# Patient Record
Sex: Female | Born: 1965 | Race: White | Hispanic: No | Marital: Married | Smoking: Former smoker
Health system: Southern US, Community
[De-identification: ages and names within clinical notes are randomized; demographics above are authoritative.]

## PROBLEM LIST (undated history)

## (undated) DIAGNOSIS — F411 Generalized anxiety disorder: Secondary | ICD-10-CM

## (undated) DIAGNOSIS — G5600 Carpal tunnel syndrome, unspecified upper limb: Secondary | ICD-10-CM

## (undated) DIAGNOSIS — F419 Anxiety disorder, unspecified: Secondary | ICD-10-CM

## (undated) HISTORY — DX: Generalized anxiety disorder: F41.1

## (undated) HISTORY — DX: Carpal tunnel syndrome, unspecified upper limb: G56.00

## (undated) HISTORY — DX: Anxiety disorder, unspecified: F41.9

---
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
## 1998-10-01 ENCOUNTER — Ambulatory Visit (HOSPITAL_COMMUNITY): Admission: RE | Admit: 1998-10-01 | Discharge: 1998-10-01 | Payer: Self-pay | Admitting: Obstetrics and Gynecology

## 1998-11-09 ENCOUNTER — Ambulatory Visit (HOSPITAL_COMMUNITY): Admission: RE | Admit: 1998-11-09 | Discharge: 1998-11-09 | Payer: Self-pay | Admitting: Obstetrics and Gynecology

## 1999-10-19 ENCOUNTER — Encounter: Payer: Self-pay | Admitting: Emergency Medicine

## 1999-10-19 ENCOUNTER — Emergency Department (HOSPITAL_COMMUNITY): Admission: EM | Admit: 1999-10-19 | Discharge: 1999-10-19 | Payer: Self-pay | Admitting: Emergency Medicine

## 2000-01-26 ENCOUNTER — Other Ambulatory Visit: Admission: RE | Admit: 2000-01-26 | Discharge: 2000-01-26 | Payer: Self-pay | Admitting: Obstetrics and Gynecology

## 2000-04-01 ENCOUNTER — Emergency Department (HOSPITAL_COMMUNITY): Admission: EM | Admit: 2000-04-01 | Discharge: 2000-04-01 | Payer: Self-pay | Admitting: Emergency Medicine

## 2000-04-01 ENCOUNTER — Encounter: Payer: Self-pay | Admitting: Emergency Medicine

## 2001-02-05 ENCOUNTER — Other Ambulatory Visit: Admission: RE | Admit: 2001-02-05 | Discharge: 2001-02-05 | Payer: Self-pay | Admitting: Obstetrics and Gynecology

## 2002-03-14 ENCOUNTER — Emergency Department (HOSPITAL_COMMUNITY): Admission: EM | Admit: 2002-03-14 | Discharge: 2002-03-14 | Payer: Self-pay | Admitting: Emergency Medicine

## 2002-05-15 ENCOUNTER — Other Ambulatory Visit: Admission: RE | Admit: 2002-05-15 | Discharge: 2002-05-15 | Payer: Self-pay | Admitting: Obstetrics and Gynecology

## 2002-08-06 ENCOUNTER — Emergency Department (HOSPITAL_COMMUNITY): Admission: EM | Admit: 2002-08-06 | Discharge: 2002-08-06 | Payer: Self-pay | Admitting: Emergency Medicine

## 2002-08-06 ENCOUNTER — Encounter: Payer: Self-pay | Admitting: Emergency Medicine

## 2003-05-28 ENCOUNTER — Other Ambulatory Visit: Admission: RE | Admit: 2003-05-28 | Discharge: 2003-05-28 | Payer: Self-pay | Admitting: Obstetrics and Gynecology

## 2004-07-27 ENCOUNTER — Other Ambulatory Visit: Admission: RE | Admit: 2004-07-27 | Discharge: 2004-07-27 | Payer: Self-pay | Admitting: Obstetrics and Gynecology

## 2005-04-04 ENCOUNTER — Other Ambulatory Visit: Admission: RE | Admit: 2005-04-04 | Discharge: 2005-04-04 | Payer: Self-pay | Admitting: Obstetrics and Gynecology

## 2005-09-05 ENCOUNTER — Encounter: Admission: RE | Admit: 2005-09-05 | Discharge: 2005-11-06 | Payer: Self-pay | Admitting: Obstetrics and Gynecology

## 2005-11-07 ENCOUNTER — Inpatient Hospital Stay (HOSPITAL_COMMUNITY): Admission: AD | Admit: 2005-11-07 | Discharge: 2005-11-11 | Payer: Self-pay | Admitting: Obstetrics and Gynecology

## 2005-12-21 ENCOUNTER — Other Ambulatory Visit: Admission: RE | Admit: 2005-12-21 | Discharge: 2005-12-21 | Payer: Self-pay | Admitting: Obstetrics and Gynecology

## 2006-02-21 ENCOUNTER — Ambulatory Visit: Payer: Self-pay | Admitting: Family Medicine

## 2006-09-26 ENCOUNTER — Ambulatory Visit: Payer: Self-pay | Admitting: Family Medicine

## 2006-12-19 ENCOUNTER — Ambulatory Visit: Payer: Self-pay | Admitting: Family Medicine

## 2007-01-21 ENCOUNTER — Ambulatory Visit: Payer: Self-pay | Admitting: Family Medicine

## 2007-01-23 ENCOUNTER — Ambulatory Visit: Payer: Self-pay | Admitting: Family Medicine

## 2007-04-15 ENCOUNTER — Ambulatory Visit: Payer: Self-pay | Admitting: Internal Medicine

## 2007-04-15 DIAGNOSIS — F411 Generalized anxiety disorder: Secondary | ICD-10-CM

## 2007-04-15 HISTORY — DX: Generalized anxiety disorder: F41.1

## 2007-09-11 ENCOUNTER — Ambulatory Visit: Payer: Self-pay | Admitting: Family Medicine

## 2007-09-11 LAB — CONVERTED CEMR LAB: Rapid Strep: NEGATIVE

## 2007-09-13 ENCOUNTER — Telehealth (INDEPENDENT_AMBULATORY_CARE_PROVIDER_SITE_OTHER): Payer: Self-pay | Admitting: Internal Medicine

## 2008-03-10 ENCOUNTER — Ambulatory Visit: Payer: Self-pay | Admitting: Family Medicine

## 2008-03-10 DIAGNOSIS — M545 Low back pain, unspecified: Secondary | ICD-10-CM | POA: Insufficient documentation

## 2008-04-03 ENCOUNTER — Encounter: Admission: RE | Admit: 2008-04-03 | Discharge: 2008-04-03 | Payer: Self-pay | Admitting: Obstetrics and Gynecology

## 2008-10-07 ENCOUNTER — Ambulatory Visit: Payer: Self-pay | Admitting: Family Medicine

## 2008-10-07 DIAGNOSIS — R1013 Epigastric pain: Secondary | ICD-10-CM | POA: Insufficient documentation

## 2008-10-07 DIAGNOSIS — R1032 Left lower quadrant pain: Secondary | ICD-10-CM | POA: Insufficient documentation

## 2008-12-24 ENCOUNTER — Ambulatory Visit: Payer: Self-pay | Admitting: Family Medicine

## 2008-12-24 DIAGNOSIS — M722 Plantar fascial fibromatosis: Secondary | ICD-10-CM | POA: Insufficient documentation

## 2008-12-24 DIAGNOSIS — M5430 Sciatica, unspecified side: Secondary | ICD-10-CM | POA: Insufficient documentation

## 2009-02-10 ENCOUNTER — Ambulatory Visit: Payer: Self-pay | Admitting: Internal Medicine

## 2009-02-10 DIAGNOSIS — M542 Cervicalgia: Secondary | ICD-10-CM | POA: Insufficient documentation

## 2009-04-06 ENCOUNTER — Ambulatory Visit: Payer: Self-pay | Admitting: Family Medicine

## 2009-04-06 DIAGNOSIS — R5381 Other malaise: Secondary | ICD-10-CM | POA: Insufficient documentation

## 2009-04-06 DIAGNOSIS — R0609 Other forms of dyspnea: Secondary | ICD-10-CM | POA: Insufficient documentation

## 2009-04-06 DIAGNOSIS — R5383 Other fatigue: Secondary | ICD-10-CM

## 2009-04-06 DIAGNOSIS — R0989 Other specified symptoms and signs involving the circulatory and respiratory systems: Secondary | ICD-10-CM

## 2009-04-07 LAB — CONVERTED CEMR LAB
ALT: 16 units/L (ref 0–35)
AST: 14 units/L (ref 0–37)
Albumin: 3.4 g/dL — ABNORMAL LOW (ref 3.5–5.2)
Alkaline Phosphatase: 93 units/L (ref 39–117)
BUN: 7 mg/dL (ref 6–23)
Basophils Absolute: 0 10*3/uL (ref 0.0–0.1)
Basophils Relative: 0.4 % (ref 0.0–3.0)
Bilirubin, Direct: 0 mg/dL (ref 0.0–0.3)
CO2: 28 meq/L (ref 19–32)
Calcium: 9.3 mg/dL (ref 8.4–10.5)
Chloride: 106 meq/L (ref 96–112)
Cholesterol: 175 mg/dL (ref 0–200)
Creatinine, Ser: 0.7 mg/dL (ref 0.4–1.2)
Direct LDL: 91.9 mg/dL
Eosinophils Absolute: 0.2 10*3/uL (ref 0.0–0.7)
Eosinophils Relative: 1.4 % (ref 0.0–5.0)
GFR calc non Af Amer: 97.77 mL/min (ref >60)
Glucose, Bld: 85 mg/dL (ref 70–99)
HCT: 38.8 % (ref 36.0–46.0)
HDL: 46.6 mg/dL (ref >39.00)
Hemoglobin: 13.3 g/dL (ref 12.0–15.0)
Lymphocytes Relative: 26.8 % (ref 12.0–46.0)
Lymphs Abs: 3 10*3/uL (ref 0.7–4.0)
MCHC: 34.3 g/dL (ref 30.0–36.0)
MCV: 89.7 fL (ref 78.0–100.0)
Monocytes Absolute: 0.7 10*3/uL (ref 0.1–1.0)
Monocytes Relative: 6.3 % (ref 3.0–12.0)
Neutro Abs: 7.4 10*3/uL (ref 1.4–7.7)
Neutrophils Relative %: 65.1 % (ref 43.0–77.0)
Platelets: 335 10*3/uL (ref 150.0–400.0)
Potassium: 4.6 meq/L (ref 3.5–5.1)
RBC: 4.33 M/uL (ref 3.87–5.11)
RDW: 13.5 % (ref 11.5–14.6)
Sodium: 140 meq/L (ref 135–145)
TSH: 1.76 microintl units/mL (ref 0.35–5.50)
Total Bilirubin: 0.4 mg/dL (ref 0.3–1.2)
Total CHOL/HDL Ratio: 4
Total Protein: 7 g/dL (ref 6.0–8.3)
Triglycerides: 221 mg/dL — ABNORMAL HIGH (ref 0.0–149.0)
VLDL: 44.2 mg/dL — ABNORMAL HIGH (ref 0.0–40.0)
WBC: 11.3 10*3/uL — ABNORMAL HIGH (ref 4.5–10.5)

## 2009-04-14 ENCOUNTER — Telehealth (INDEPENDENT_AMBULATORY_CARE_PROVIDER_SITE_OTHER): Payer: Self-pay | Admitting: Internal Medicine

## 2009-04-15 ENCOUNTER — Ambulatory Visit: Payer: Self-pay | Admitting: Pulmonary Disease

## 2009-08-12 ENCOUNTER — Ambulatory Visit: Payer: Self-pay | Admitting: Family Medicine

## 2009-08-16 ENCOUNTER — Ambulatory Visit: Payer: Self-pay | Admitting: Internal Medicine

## 2009-10-12 ENCOUNTER — Ambulatory Visit: Payer: Self-pay | Admitting: Family Medicine

## 2009-10-12 DIAGNOSIS — H6980 Other specified disorders of Eustachian tube, unspecified ear: Secondary | ICD-10-CM | POA: Insufficient documentation

## 2009-10-12 DIAGNOSIS — H6990 Unspecified Eustachian tube disorder, unspecified ear: Secondary | ICD-10-CM | POA: Insufficient documentation

## 2010-10-31 ENCOUNTER — Ambulatory Visit: Payer: Self-pay | Admitting: Family Medicine

## 2010-10-31 DIAGNOSIS — J0110 Acute frontal sinusitis, unspecified: Secondary | ICD-10-CM | POA: Insufficient documentation

## 2010-10-31 DIAGNOSIS — J018 Other acute sinusitis: Secondary | ICD-10-CM | POA: Insufficient documentation

## 2011-01-17 NOTE — Letter (Signed)
Summary: Out of Work  [facility]  [address] [place], [place] [address]   Phone: [phone]  Fax: [phone]    October 31, 2010   Employee:  Barbara Riley    To Whom It May Concern:   For Medical reasons, please excuse the above named employee from work until cough resolved.  Potentially contagious.   If you need additional information, please feel free to contact our office.         Sincerely,    Crawford Givens MD

## 2011-01-17 NOTE — Assessment & Plan Note (Signed)
Summary: COUGH,CHILLS/CLE   Vital Signs:  Patient profile:   45 year old female Height:      68 inches Weight:      195.50 pounds BMI:     29.83 Temp:     98.5 degrees F oral Pulse rate:   92 / minute Pulse rhythm:   regular BP sitting:   124 / 80  (left arm) Cuff size:   regular  Vitals Entered By: Delilah Shan CMA Duncan Dull) (October 31, 2010 4:01 PM) CC: Cough, chills, fever.   History of Present Illness: Had flu shot at [facility] 10/19/10.  Scratchy throat.  Had to work outside in the cold since then and weather has changed multiple times since then.  Cough and "I can't get my breath until I blow my nose."  Teeth hurting and ears clogged.  Pain across face.  Frontal HA.  No fevers.  No NAVD, no rash.  Dry cough.  No wheeze.    Had some dental work done recently.    Allergies: 1)  ! 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  Patient currently smokes. 1 ppd.   Alcohol Use - no Daily Caffeine Use 2 Illicit Drug Use - no Patient has been counseled to quit smoking.  Review of Systems       See HPI.  Otherwise negative.    Physical Exam  General:  GEN: nad, alert and oriented HEENT: mucous membranes moist, TM w/o erythema, nasal epithelium injected, OP with cobblestoning, no exudates, frontal sinuses tender to palpation  NECK: supple w/o LA CV: rrr. PULM: ctab, no inc wob   Impression & Recommendations:  Problem # 1:  ACUTE FRONTAL SINUSITIS (ICD-461.1) Start antibiotics and use tessalon for cough.  Lungs clear.  supporitve tx o/w and follow up as needed.  Nontoxic.  D/w patient.  I don't think this is due to flu shot.  Her updated medication list for this problem includes:    Zithromax 250 Mg Tabs (Azithromycin) .Marland Kitchen... 2 by mouth today and then 1 by mouth once daily for 4 days.    Tessalon 200 Mg Caps (Benzonatate) .Marland Kitchen... 1 by mouth three times a day for cough  Complete Medication List: 1)  Paxil 10 Mg Tabs  (Paroxetine hcl) .... Take 1 tablet by mouth once a day 2)  Cvs Ibuprofen 200 Mg Tabs (Ibuprofen) .... Take 4 tabsevery 8hrs as needed pain 3)  Gas-x 80 Mg Chew (Simethicone) .... Otc as directed. 4)  Prilosec Otc 20 Mg Tbec (Omeprazole magnesium) .Marland Kitchen.. 1 each day 30 minutes before meal as needed 5)  Depo-provera 150 Mg/ml Susp (Medroxyprogesterone acetate) .... One injection every 3 months. 6)  Zithromax 250 Mg Tabs (Azithromycin) .... 2 by mouth today and then 1 by mouth once daily for 4 days. 7)  Tessalon 200 Mg Caps (Benzonatate) .Marland Kitchen.. 1 by mouth three times a day for cough  Patient Instructions: 1)  Get plenty of rest, drink lots of clear liquids, and use Tylenol or Ibuprofen for fever and comfort.  Start the antibiotics today and use the tessalon for cough.  Take care.  Cut down on smoking.  Prescriptions: TESSALON 200 MG CAPS (BENZONATATE) 1 by mouth three times a day for cough  #30 x 1   Entered and Authorized by:   Crawford Givens MD   Signed by:   Crawford Givens MD on 10/31/2010   Method used:   [facility]  [place] (retail)       [address]       [place], [place]  [address]       Ph: [phone] or [phone]       Fax: [phone]   RxID:   [id number] ZITHROMAX 250 MG TABS (AZITHROMYCIN) 2 by mouth today and then 1 by mouth once daily for 4 days.  #6 x 0   Entered and Authorized by:   Crawford Givens MD   Signed by:   Crawford Givens MD on 10/31/2010   Method used:   [facility]        [id number]       [address]       [place], [place]  [address]       Ph: [phone] or [phone]       Fax: [phone]   RxID:   [id number]    Orders Added: 1)  Est. Patient Level III [20254]    Current Allergies (reviewed today): ! PCN

## 2011-05-05 NOTE — Op Note (Signed)
Barbara Riley, Barbara Riley                 ACCOUNT NO.:  [id number]   MEDICAL RECORD NO.:  [id number]          PATIENT TYPE:  INP   LOCATION:  9145                          [facility]   PHYSICIAN:  Juluis Mire, M.D.   DATE OF BIRTH:  01/07/1966   DATE OF PROCEDURE:  11/08/2005  DATE OF DISCHARGE:                                 OPERATIVE REPORT   PREOPERATIVE DIAGNOSES:  Intrauterine pregnancy at term. Nonreassuring fetal  heart rate pattern.   POSTOPERATIVE DIAGNOSES:  Intrauterine pregnancy at term. Nonreassuring  fetal heart rate pattern. Nuchal cord.   OPERATION:  Low transverse cesarean section.   SURGEON:  Juluis Mire, M.D.   ANESTHESIA:  Epidural.   ESTIMATED BLOOD LOSS:  400 to 500 mL.   PACKS/DRAINS:  None.   BLOOD REPLACED:  None.   COMPLICATIONS:  None.   INDICATIONS FOR PROCEDURE:  A 45 year old, primigravida, married female  presented at term with spontaneous rupture of membranes. We got her to  progress to approximately 8 cm of dilatation with Pitocin. She started to  have repetitive decelerations. This resolved once the Pitocin was  discontinued. However, with restarting the Pitocin the decelerations  reoccurred and there was no rapid progression. Due to the decel, the  decision was to proceed with primary cesarean section. The risks were  discussed including the risk of infection. The risk of hemorrhage. The risk  of injury to adjacent organs that could require further exploratory surgery.  The risk of deep venous thrombosis and pulmonary embolus. The patient  expressed an understanding of the indications and risks.   DESCRIPTION OF PROCEDURE:  The patient was taken to the OR and placed in  supine position with left lateral tilt. After a satisfactory level of  epidural anesthesia was obtained, the abdomen was prepped out with Betadine,  draped in a sterile field. A low transverse skin incision made with a knife,  carried through the subcutaneous  tissue. The fascia was entered sharply and  the incision in the fascia extended laterally. The fascia was taken off the  muscles superiorly and inferiorly. The rectus muscles were separated in the  midline. The perineum was entered sharply, the incision in the perineum  extended both superiorly and inferiorly. A low transverse bladder flap was  developed, a low transverse uterine incision was begun with a knife,  extended laterally using manual traction. Amniotomy fluid was clear. The  infant presented in the vertex presentation, delivered with elevation of  head and fundal pressure. The infant was a viable female weighing 7 pounds 13  ounces. Apgar's were 9/9, umbilical cord pH was 7.29. There was a nuchal  cord x3. The placenta was delivered manually, uterus wiped free from  remaining membranes and placenta. The uterus was then closed with running  locked suture of #0 chromic using a 2 layer closure technique. We had good  hemostasis and clear urine output. The tubes and ovaries were unremarkable.  Muscles were reapproximated with running suture of 3-0 Vicryl, fascia closed  with running suture of #0 PDS. The skin was closed with staples  and  Steri-Strips. Sponge, instrument and needle counts were reported as correct  by circulating nurse x2. Foley catheter remained clear at time of closure.  The patient tolerated the procedure well and was returned to the recovery  room in good condition.      Juluis Mire, M.D.  Electronically Signed     JSM/MEDQ  D:  11/08/2005  T:  11/08/2005  Job:  [id number]

## 2011-05-05 NOTE — Discharge Summary (Signed)
Barbara Riley, Barbara Riley                 ACCOUNT NO.:  [id number]   MEDICAL RECORD NO.:  [id number]          PATIENT TYPE:  INP   LOCATION:  9145                          [facility]ILITY:  [facility]   PHYSICIAN:  Dineen Kid. Rana Snare, M.D.    DATE OF BIRTH:  07-05-1966   DATE OF ADMISSION:  11/07/2005  DATE OF DISCHARGE:  11/11/2005                                 DISCHARGE SUMMARY   ADMITTING DIAGNOSIS:  1.  Intrauterine pregnancy at term.  2.  Spontaneous rupture of membranes.   DISCHARGE DIAGNOSIS:  1.  Status post low transverse cesarean section secondary to non-reassuring      fetal heart tones.  2.  Viable female infant.   PROCEDURE:  Primary low transverse cesarean section.   REASON FOR ADMISSION:  Please see written H&P.   HOSPITAL COURSE:  The patient is 45 year old white married female,  primigravida, that was admitted to [facility] at term with  spontaneous rupture of membranes.  The patient did progress to 8-cm dilated  with augmentation of her labor with Pitocin.  At that time, the patient was  noted to have repetitive decelerations.  Pitocin was discontinued.  However  upon restarting the Pitocin, decelerations did reoccur with no rapid  progression in cervical dilatation.  Due to non-reassuring fetal heart  tones, the decision was made to proceed with a low transverse cesarean  section.  The patient was then transferred to the operating room, where  epidural was dosed to an adequate surgical level.  A low transverse incision  was made with the delivery of a viable female infant weighing 7 pounds 13  ounces with Apgar's of 9 at one minute and 9 at 5 minutes.  Umbilical cord  pH of 7.29.  The patient tolerated the procedure well and was taken to  recovery room in stable condition.  On postoperative day #1, the patient was without complaint.  Vital signs  were stable.  Abdomen soft.  Fundus firm and nontender. Abdominal dressing  was noted to be clean, dry, and intact.   Laboratory findings revealed  hemoglobin 9.7, platelet count of 320,000, WBC count of 16.0.  On postoperative day #2, the patient did experience some with difficulty  breast-feeding.  Vital signs were otherwise stable.  She was afebrile.  Abdomen soft.  Fundus firm and nontender.  Abdominal dressing had been  removed revealing the incision was clean, dry, and intact.  On postoperative day #3, the patient was without complaint.  Breast feeding  was going well.  Vital signs were stable.  She was afebrile.  Fundus firm  and nontender.  Incision was clean, dry, and intact.  Staples removed.  The  patient was discharged home.   CONDITION ON DISCHARGE:  Good.   DIET:  Regular as tolerated.   ACTIVITY:  No heavy lifting, no driving x2 weeks, no vaginal entry.   FOLLOW UP:  Patient to follow up in the office in 1-2 weeks for incision  check.   She is to call for temperature greater than 100 degrees, persistent nausea,  vomiting, heavy vaginal bleeding, and/or  redness or drainage from the  incisional site.   DISCHARGE MEDICATIONS:  1.  Tylox, #30, one p.o. every four to six hours p.r.n.  2.  Motrin 600 mg every six hours.  3.  Prenatal vitamins one p.o. daily.      Julio Sicks, N.P.      Dineen Kid Rana Snare, M.D.  Electronically Signed    CC/MEDQ  D:  12/01/2005  T:  12/02/2005  Job:  [id number]

## 2011-08-25 ENCOUNTER — Other Ambulatory Visit: Payer: Self-pay | Admitting: Obstetrics and Gynecology

## 2011-08-25 DIAGNOSIS — R928 Other abnormal and inconclusive findings on diagnostic imaging of breast: Secondary | ICD-10-CM

## 2011-08-31 ENCOUNTER — Ambulatory Visit
Admission: RE | Admit: 2011-08-31 | Discharge: 2011-08-31 | Disposition: A | Discharge status: Home / Self Care | Payer: 59 | Source: Ambulatory Visit | Attending: Obstetrics and Gynecology | Admitting: Obstetrics and Gynecology

## 2011-08-31 DIAGNOSIS — R928 Other abnormal and inconclusive findings on diagnostic imaging of breast: Secondary | ICD-10-CM

## 2011-09-08 ENCOUNTER — Encounter: Payer: Self-pay | Admitting: Family Medicine

## 2011-09-08 ENCOUNTER — Ambulatory Visit (INDEPENDENT_AMBULATORY_CARE_PROVIDER_SITE_OTHER): Payer: 59 | Admitting: Family Medicine

## 2011-09-08 VITALS — BP 126/84 | HR 84 | Temp 98.2°F | Ht 68.0 in | Wt 201.0 lb

## 2011-09-08 DIAGNOSIS — R51 Headache: Secondary | ICD-10-CM

## 2011-09-08 DIAGNOSIS — R519 Headache, unspecified: Secondary | ICD-10-CM

## 2011-09-08 MED ORDER — INDOMETHACIN 50 MG PO CAPS: 50.0000 mg | ORAL_CAPSULE | Freq: Two times a day (BID) | ORAL | Status: AC | PRN

## 2011-09-08 NOTE — Assessment & Plan Note (Addendum)
1d h/o unilateral headache. 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 Treat with indocin, as several of these trigeminal processes are responsive to indomethacin. Description of pain and episodic nature points against life threatening process, but if not improved may recommend imaging. Red flags to seek ER evaluation discussed. No rash, advised to monitor and discussed possibility of shingles although strange distribution as well.

## 2011-09-08 NOTE — Patient Instructions (Signed)
Treat this headache with indomethacin twice daily as needed. Don't take this with other anti inflammatory like ibuprofen or advil.  Ok to take with tylenol. Update [place] if not improving. If worsening, please seek urgent medical care.

## 2011-09-08 NOTE — Progress Notes (Signed)
  Subjective:    Patient ID: Barbara Riley, female    DOB: July 17, 1966, 45 y.o.   MRN: [id number]  HPI CC: "electricity running through the left side of my head"  Last week having sinus congestion, taking dayquil for sinuses as well as zyrtec.  Yesterday morning improved, then yesterday after lunch started having sharp pains behind left ear, now shooting pain throughout left side of head, feels like "lightning, electricity".  Even sensitive to touch.  Also with tingling in left arm.  Pain described as shooting through head, left side only, episode lasts 1-2 seconds, has several throughout the day.  Used heating pad this morning which helped.  Hasn't used anything else.    Still with mild dry cough.  + tooth pain worse at night. + h/o migraines, this is different.  No fevers/chills, vision changes, chest pain, sob, nausea/vomiting, hearing changes, watery eyes.  No slurred speech or unilateral weakness.  No photo/phonophobia.  No ear pain or ST.  Congestion is better.  No unilateral tearing, or conjunctivitis, no eyelid droop or facial weakness.  No temporal pain.  No pain along trigeminal distribution.  No olfactory sxs or aura.  No h/o HTN.  Smoking 1 ppd.  Review of Systems Per HPI    Objective:   Physical Exam  Nursing note and vitals reviewed. Constitutional: She appears well-developed and well-nourished. No distress.  HENT:  Head: Normocephalic and atraumatic.  Right Ear: Hearing, tympanic membrane, external ear and ear canal normal.  Left Ear: Hearing, tympanic membrane, external ear and ear canal normal.  Nose: Nose normal. No mucosal edema or rhinorrhea. Right sinus exhibits no maxillary sinus tenderness and no frontal sinus tenderness. Left sinus exhibits no maxillary sinus tenderness and no frontal sinus tenderness.  Mouth/Throat: Uvula is midline, oropharynx is clear and moist and mucous membranes are normal. No oropharyngeal exudate, posterior oropharyngeal edema, posterior  oropharyngeal erythema or tonsillar abscesses.       No temporal pain.  + tender to touch left parietal and occipital scalp  Eyes: Conjunctivae and EOM are normal. Pupils are equal, round, and reactive to light. No scleral icterus.       Limited fundoscopy grossly normal, no papilledema appreciated. Nonsustained nystagmus to lateral gaze.  Neck: Normal range of motion. Neck supple. No thyromegaly present.       FROM, no neck stiffness  Cardiovascular: Normal rate, regular rhythm, normal heart sounds and intact distal pulses.   No murmur heard. Pulmonary/Chest: Effort normal and breath sounds normal. No respiratory distress. She has no wheezes. She has no rales.  Musculoskeletal: She exhibits no edema.  Lymphadenopathy:    She has no cervical adenopathy.  Neurological: She has normal strength. No cranial nerve deficit or sensory deficit. She displays a negative Romberg sign. Coordination normal.       Normal FTN, no dysdiadokokinesia  Skin: Skin is warm and dry. No rash noted.  Psychiatric: She has a normal mood and affect.          Assessment & Plan:

## 2011-09-11 ENCOUNTER — Telehealth: Payer: Self-pay | Admitting: *Deleted

## 2011-09-11 MED ORDER — CYCLOBENZAPRINE HCL 10 MG PO TABS: 10.0000 mg | ORAL_TABLET | Freq: Three times a day (TID) | ORAL | Status: AC | PRN

## 2011-09-11 MED ORDER — CYCLOBENZAPRINE HCL 10 MG PO TABS: 10.0000 mg | ORAL_TABLET | Freq: Three times a day (TID) | ORAL | Status: DC | PRN

## 2011-09-11 NOTE — Telephone Encounter (Signed)
Please notify given tightness sensation, would like to treat her with muscle relaxant to see if any improvement.  Sent in, caution this medicine can make her sleepy so don't take and drive.  If not better, needs to return for further assessment.

## 2011-09-11 NOTE — Telephone Encounter (Signed)
Spoke with patient and advised to try flexeril and if no better to come back for further eval. [facility] [place] [facility] She [place] closer to that store. She will come back if no improvement.

## 2011-09-11 NOTE — Telephone Encounter (Signed)
[facility] [place] [facility]. [facility] [place].

## 2011-09-11 NOTE — Telephone Encounter (Signed)
Note continued from below.  Pt just called back.  The sharp pains in her left ear have come back, the pain has moved from her neck to her shoulders and her shoulders feel tight.

## 2011-09-11 NOTE — Telephone Encounter (Signed)
Pt was seen on Friday.  She says she is not any better, fells slow- the tingling is better, but this feels like it's going down into her face.  She doesn't feel normal.  She would like tests to determine if she has had a stroke.  She didn't take indomethacin this morning, she says this doesn't help, thinks it makes things worse. If tests are done she prefers to go to Barbara Riley.

## 2011-12-13 ENCOUNTER — Other Ambulatory Visit: Payer: Self-pay | Admitting: Family Medicine

## 2011-12-13 DIAGNOSIS — E781 Pure hyperglyceridemia: Secondary | ICD-10-CM

## 2011-12-22 ENCOUNTER — Other Ambulatory Visit (INDEPENDENT_AMBULATORY_CARE_PROVIDER_SITE_OTHER): Payer: 59

## 2011-12-22 DIAGNOSIS — E781 Pure hyperglyceridemia: Secondary | ICD-10-CM

## 2011-12-22 LAB — LDL CHOLESTEROL, DIRECT: Direct LDL: 146.2 mg/dL

## 2011-12-22 LAB — LIPID PANEL: HDL: 35.2 mg/dL — ABNORMAL LOW (ref >39.00)

## 2011-12-27 ENCOUNTER — Encounter: Payer: Self-pay | Admitting: Family Medicine

## 2011-12-27 ENCOUNTER — Ambulatory Visit (INDEPENDENT_AMBULATORY_CARE_PROVIDER_SITE_OTHER): Payer: 59 | Admitting: Family Medicine

## 2011-12-27 VITALS — BP 122/82 | HR 80 | Temp 98.5°F | Ht 69.0 in | Wt 198.0 lb

## 2011-12-27 DIAGNOSIS — Z0000 Encounter for general adult medical examination without abnormal findings: Secondary | ICD-10-CM | POA: Insufficient documentation

## 2011-12-27 DIAGNOSIS — Z23 Encounter for immunization: Secondary | ICD-10-CM

## 2011-12-27 DIAGNOSIS — M545 Low back pain, unspecified: Secondary | ICD-10-CM | POA: Insufficient documentation

## 2011-12-27 DIAGNOSIS — F17200 Nicotine dependence, unspecified, uncomplicated: Secondary | ICD-10-CM | POA: Insufficient documentation

## 2011-12-27 LAB — POCT URINALYSIS DIPSTICK
Bilirubin, UA: NEGATIVE
Glucose, UA: NEGATIVE
Nitrite, UA: NEGATIVE
pH, UA: 6

## 2011-12-27 MED ORDER — BUPROPION HCL ER (SR) 150 MG PO TB12: 150.0000 mg | ORAL_TABLET | Freq: Two times a day (BID) | ORAL | Status: DC

## 2011-12-27 NOTE — Patient Instructions (Signed)
I've sent in wellbutrin to your pharmacy.  Take 150mg  daily for 3 days then increase to 150 mg twice daily.  Set quit date 1-2 weeks into medicine. Look into [email or web address] Remember - less soda, more water, try to incorporate walking into routine. We will keep eye on sugar. Good to see you today, call us with questions. Return in 1 year for follow up, prior fasting for blood work.

## 2011-12-27 NOTE — Assessment & Plan Note (Addendum)
Seems to be resolving. Not typical sxs UTI. Check UA today to r/o infection - unclear if contaminant, sent culture.  Will update pt based on results. Otherwise monitor, update me if sxs returning.

## 2011-12-27 NOTE — Assessment & Plan Note (Signed)
Discussed different cessation assistance options. Pt would like to start wellbutrin.  Sent in and discussed use of this medicine. quitline[place] resource provided.

## 2011-12-27 NOTE — Assessment & Plan Note (Signed)
Reviewed preventative protocols, updated unless pt declined. Tdap today. UTD flu. Discussed healthy lifestyle, more activity, less sodas

## 2011-12-27 NOTE — Progress Notes (Signed)
Subjective:    Patient ID: Barbara Riley, female    DOB: 06-12-1966, 46 y.o.   MRN: [id number]  HPI CC: CPE today  Seen here 07/2011 with HA.  Headaches improved when stress improved.  Uses flexeril prn tension in neck.  Indocin didn't help.  H/o migraines, fmhx CVA.  Has been having some back pain.  No dysuria, urgency, polyuria, fevers/chills, n/v.  Over weekend started azo which helped.  Also started drinking more water, less soda/caffeine which has helped.  Back pain now resolved.  Smoking <1 ppd.  Smoking since age 42yo.  Thinking about quitting.  Tried chantix in past, "made me evil".  Considering wellbutrin.  Preventative: Well woman late 2012.  Recent mammogram -abnormal, going Q6 mo for recheck.  Pap normal. CPE today. Tetanus - unsure, thinks >5 yrs ago.  Requests Tdap today. Flu shot done. 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  Past histories reviewed and updated if relevant as below. Patient Active Problem List  Diagnoses  . ANXIETY  . DYSFUNCTION OF EUSTACHIAN TUBE  . ACUTE FRONTAL SINUSITIS  . OTHER ACUTE SINUSITIS  . NECK PAIN, RIGHT  . BACK PAIN, LUMBAR  . SCIATICA, LEFT  . PLANTAR FASCIITIS  . FATIGUE  . SNORING  . ABDOMINAL PAIN, LEFT LOWER QUADRANT  . EPIGASTRIC PAIN  . Unilateral headache   Past Medical History  Diagnosis Date  . Anxiety     Stress with toddler and bills   Past Surgical History  Procedure Date  . Cesarean section    History  Substance Use Topics  . Smoking status: Current Everyday Smoker -- 1.0 packs/day    Types: Cigarettes  . Smokeless tobacco: Never Used  . Alcohol Use: No   Family History  Problem Relation Age of Onset  . Heart disease Father   .  Heart disease      GM  . Liver cancer      GM   Allergies  Allergen Reactions  . Penicillins     REACTION: rash   Current Outpatient Prescriptions on File Prior to Visit  Medication Sig Dispense Refill  . cyclobenzaprine (FLEXERIL) 10 MG tablet Take 1 tablet (10 mg total) by mouth every 8 (eight) hours as needed for muscle spasms.  30 tablet  1  . ibuprofen (ADVIL,MOTRIN) 200 MG tablet Take 800 mg by mouth every 8 (eight) hours as needed.        . medroxyPROGESTERone (DEPO-PROVERA) 150 MG/ML injection Inject 150 mg into the muscle every 3 (three) months.        Marland Kitchen PARoxetine (PAXIL-CR) 25 MG 24 hr tablet Take 25 mg by mouth every morning.        . simethicone (MYLICON) 80 MG chewable tablet Chew 80 mg by mouth every 6 (six) hours as needed.        Marland Kitchen omeprazole (PRILOSEC OTC) 20 MG tablet Take 20 mg by mouth daily.         Review of Systems  Constitutional: Negative for fever, chills, activity change, appetite change, fatigue and unexpected weight change.  HENT: Negative for hearing loss and neck pain.   Eyes: Negative for visual disturbance.  Respiratory: Positive for cough (smoker). Negative for chest tightness, shortness of breath and wheezing.  Cardiovascular: Negative for chest pain, palpitations and leg swelling.  Gastrointestinal: Negative for nausea, vomiting, abdominal pain, diarrhea, constipation, blood in stool and abdominal distention.  Genitourinary: Negative for hematuria and difficulty urinating.  Musculoskeletal: Negative for myalgias and arthralgias.  Skin: Negative for rash.  Neurological: Negative for dizziness, seizures, syncope and headaches.  Hematological: Does not bruise/bleed easily.  Psychiatric/Behavioral: Negative for dysphoric mood. The patient is not nervous/anxious.        Objective:   Physical Exam  Nursing note and vitals reviewed. Constitutional: She is oriented to person, place, and time. She appears well-developed and well-nourished. No  distress.  HENT:  Head: Normocephalic and atraumatic.  Right Ear: External ear normal.  Left Ear: External ear normal.  Nose: Nose normal.  Mouth/Throat: Oropharynx is clear and moist. No oropharyngeal exudate.  Eyes: Conjunctivae and EOM are normal. Pupils are equal, round, and reactive to light. No scleral icterus.  Neck: Normal range of motion. Neck supple.  Cardiovascular: Normal rate, regular rhythm, normal heart sounds and intact distal pulses.   No murmur heard. Pulses:      Radial pulses are 2+ on the right side, and 2+ on the left side.  Pulmonary/Chest: Effort normal and breath sounds normal. No respiratory distress. She has no wheezes. She has no rales.  Abdominal: Soft. Bowel sounds are normal. She exhibits no distension and no mass. There is no hepatosplenomegaly. There is no tenderness. There is no rebound, no guarding and no CVA tenderness.  Musculoskeletal: Normal range of motion.       No midline spine tenderness, no paraspinous mm tenderness  Lymphadenopathy:    She has no cervical adenopathy.  Neurological: She is alert and oriented to person, place, and time.       CN grossly intact, station and gait intact  Skin: Skin is warm and dry. No rash noted.  Psychiatric: She has a normal mood and affect. Her behavior is normal. Judgment and thought content normal.      Assessment & Plan:

## 2011-12-29 ENCOUNTER — Telehealth: Payer: Self-pay

## 2011-12-29 LAB — URINE CULTURE
Colony Count: NO GROWTH
Organism ID, Bacteria: NO GROWTH

## 2011-12-29 MED ORDER — BUPROPION HCL 75 MG PO TABS: 75.0000 mg | ORAL_TABLET | Freq: Two times a day (BID) | ORAL | Status: DC

## 2011-12-29 NOTE — Telephone Encounter (Signed)
Pt took 1st Wellbutrin 150 mg yesterday and almost immediately was extremely sleepy. Today feels like in a different world; thinks med is too strong. Pt uses [facility]. Pt can be reached at [phone].Please advise.

## 2011-12-29 NOTE — Telephone Encounter (Signed)
Patient notified. She will price the lower dose and call back if too expensive.

## 2011-12-29 NOTE — Telephone Encounter (Signed)
May stop wellbutrin sr 150.  Could try lowest dose bupropion at 75mg  twice daily (sent in) but may be more expensive.   Alternative is try nicotine replacement therapy to help smoking

## 2012-03-20 ENCOUNTER — Telehealth: Payer: Self-pay | Admitting: Family Medicine

## 2012-03-20 MED ORDER — BENZONATATE 100 MG PO CAPS: 100.0000 mg | ORAL_CAPSULE | Freq: Two times a day (BID) | ORAL | Status: DC | PRN

## 2012-03-20 NOTE — Telephone Encounter (Signed)
Message left for patient to return my call and advise if possibly tessalon perls. Will await return call.

## 2012-03-20 NOTE — Telephone Encounter (Signed)
Patient called, says she has been taking a pill for a cough. She would like to know if it can be called in to the pharmacy at [facility]. [facility]iline could not remember the name of the cough meds. Pt call back # [phone]

## 2012-03-20 NOTE — Telephone Encounter (Signed)
Patient called and confirmed it was tessalon perls. Sent into pharmacy as directed.

## 2012-03-20 NOTE — Telephone Encounter (Signed)
i don't know medicine she's referring to - is it a cough suppressant?  Ask if benzonatate 100mg  (tessalon perls).  If so, may send in #30, RF 0, indication take 1 po bid prn cough.

## 2012-06-10 ENCOUNTER — Other Ambulatory Visit: Payer: Self-pay | Admitting: Orthopedic Surgery

## 2012-06-10 ENCOUNTER — Other Ambulatory Visit: Payer: Self-pay | Admitting: Obstetrics and Gynecology

## 2012-06-10 DIAGNOSIS — N649 Disorder of breast, unspecified: Secondary | ICD-10-CM

## 2012-06-10 DIAGNOSIS — Z79899 Other long term (current) drug therapy: Secondary | ICD-10-CM

## 2012-06-13 ENCOUNTER — Encounter: Payer: Self-pay | Admitting: Family Medicine

## 2012-06-17 ENCOUNTER — Ambulatory Visit
Admission: RE | Admit: 2012-06-17 | Discharge: 2012-06-17 | Disposition: A | Discharge status: Home / Self Care | Payer: 59 | Source: Ambulatory Visit | Attending: Obstetrics and Gynecology | Admitting: Obstetrics and Gynecology

## 2012-06-17 ENCOUNTER — Ambulatory Visit
Admission: RE | Admit: 2012-06-17 | Discharge: 2012-06-17 | Disposition: A | Discharge status: Home / Self Care | Payer: 59 | Source: Ambulatory Visit | Attending: Orthopedic Surgery | Admitting: Orthopedic Surgery

## 2012-06-17 DIAGNOSIS — Z79899 Other long term (current) drug therapy: Secondary | ICD-10-CM

## 2012-06-17 DIAGNOSIS — N649 Disorder of breast, unspecified: Secondary | ICD-10-CM

## 2012-10-11 ENCOUNTER — Other Ambulatory Visit: Payer: Self-pay | Admitting: Obstetrics and Gynecology

## 2012-10-11 DIAGNOSIS — D249 Benign neoplasm of unspecified breast: Secondary | ICD-10-CM

## 2012-10-11 DIAGNOSIS — N649 Disorder of breast, unspecified: Secondary | ICD-10-CM

## 2012-12-12 ENCOUNTER — Ambulatory Visit: Payer: 59 | Admitting: Family Medicine

## 2013-09-15 ENCOUNTER — Other Ambulatory Visit: Payer: Self-pay

## 2013-10-04 ENCOUNTER — Other Ambulatory Visit (HOSPITAL_COMMUNITY): Payer: Self-pay | Admitting: *Deleted

## 2013-10-04 DIAGNOSIS — N6320 Unspecified lump in the left breast, unspecified quadrant: Secondary | ICD-10-CM

## 2013-10-07 ENCOUNTER — Ambulatory Visit (HOSPITAL_COMMUNITY): Payer: Self-pay

## 2013-10-28 ENCOUNTER — Encounter (INDEPENDENT_AMBULATORY_CARE_PROVIDER_SITE_OTHER): Payer: Self-pay

## 2013-10-28 ENCOUNTER — Encounter (HOSPITAL_COMMUNITY): Payer: Self-pay

## 2013-10-28 ENCOUNTER — Ambulatory Visit (HOSPITAL_COMMUNITY)
Admission: RE | Admit: 2013-10-28 | Discharge: 2013-10-28 | Disposition: A | Discharge status: Home / Self Care | Payer: Self-pay | Source: Ambulatory Visit | Attending: Obstetrics and Gynecology | Admitting: Obstetrics and Gynecology

## 2013-10-28 VITALS — BP 116/64 | Temp 98.6°F | Ht 68.0 in | Wt 200.2 lb

## 2013-10-28 DIAGNOSIS — N6325 Unspecified lump in the left breast, overlapping quadrants: Secondary | ICD-10-CM | POA: Insufficient documentation

## 2013-10-28 DIAGNOSIS — Z1239 Encounter for other screening for malignant neoplasm of breast: Secondary | ICD-10-CM

## 2013-10-28 NOTE — Addendum Note (Signed)
Encounter addended by: Saintclair Halsted, RN on: 10/28/2013  1:02 PM<BR>     Documentation filed: Patient Instructions Section

## 2013-10-28 NOTE — Patient Instructions (Addendum)
Taught [name] how to perform BSE. Patient did not need a Pap smear today due to last Pap smear was September 2014 per patient. Let her know [facility] will cover Pap smears every 3 years unless has a history of abnormal Pap smears. Referred patient to the [facility] for diagnostic mammogram and possible left breast ultrasound. Appointment scheduled for Wednesday, November 12, 2013 at [id number]. Patient aware of appointment and will be there. [name] verbalized understanding.  Takeem Krotzer, Kathaleen Maser, RN 10:54 AM

## 2013-10-28 NOTE — Progress Notes (Signed)
Complaints of left inner breast lump.  Pap Smear:  Pap smear not completed today. Last Pap smear was September 2014 by Dr. Marcelle Overlie and normal per patient. Per patient has no history of an abnormal Pap smear. No Pap smear results in EPIC.  Physical exam: Breasts Right breast larger than left. No skin abnormalities bilateral breasts. No nipple retraction bilateral breasts. No nipple discharge bilateral breasts. No lymphadenopathy. No lumps palpated right breast. Palpated a small lump within the left breast at 9 o'clock 9 cm from the nipple. Patient complained of tenderness when palpated left inner breast. Referred patient to the [facility] for diagnostic mammogram and possible left breast ultrasound. Appointment scheduled for Wednesday, November 12, 2013 at 4782.  Pelvic/Bimanual No Pap smear completed today since last Pap smear was September 2014 per patient. Pap smear not indicated per BCCCP guidelines.

## 2013-11-12 ENCOUNTER — Ambulatory Visit
Admission: RE | Admit: 2013-11-12 | Discharge: 2013-11-12 | Disposition: A | Discharge status: Home / Self Care | Payer: No Typology Code available for payment source | Source: Ambulatory Visit | Attending: Obstetrics and Gynecology | Admitting: Obstetrics and Gynecology

## 2013-11-12 DIAGNOSIS — N6320 Unspecified lump in the left breast, unspecified quadrant: Secondary | ICD-10-CM

## 2014-01-23 ENCOUNTER — Ambulatory Visit (INDEPENDENT_AMBULATORY_CARE_PROVIDER_SITE_OTHER): Payer: 59 | Admitting: Family Medicine

## 2014-01-23 ENCOUNTER — Encounter: Payer: Self-pay | Admitting: Family Medicine

## 2014-01-23 VITALS — BP 124/70 | HR 88 | Temp 98.0°F | Wt 197.5 lb

## 2014-01-23 DIAGNOSIS — K5900 Constipation, unspecified: Secondary | ICD-10-CM | POA: Insufficient documentation

## 2014-01-23 DIAGNOSIS — K602 Anal fissure, unspecified: Secondary | ICD-10-CM

## 2014-01-23 MED ORDER — POLYETHYLENE GLYCOL 3350 17 GM/SCOOP PO POWD: 17.0000 g | Freq: Every day | ORAL | Status: DC | PRN

## 2014-01-23 MED ORDER — NITROGLYCERIN 0.4 % RE OINT: 1.0000 "application " | TOPICAL_OINTMENT | Freq: Every day | RECTAL | Status: DC

## 2014-01-23 NOTE — Progress Notes (Signed)
   BP 124/70  Pulse 88  Temp(Src) 98 F (36.7 C) (Oral)  Wt 197 lb 8 oz (89.585 kg)  LMP 11/17/2013   CC: constipation  Subjective:    Patient ID: Barbara Riley, female    DOB: October 25, 1966, 48 y.o.   MRN: [id number]  HPI: Barbara Riley is a 48 y.o. female presenting on 01/23/2014 with Constipation  Longstanding h/o constipation, worse over last week.  Straining, hard stools, 1 stool every 1-2 days.  To point of causing anal fissure on Monday.  Did have some bleeding.  Ached and sore all day for 2 days.  Today feeling better but still scared because of pain she's had. 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 No hemorrhoids.  1 16 oz cup water/day.  Fiber - some vegetables but not regular amount.  Relevant past medical, surgical, family and social history reviewed and updated. Allergies and medications reviewed and updated. Current Outpatient Prescriptions on File Prior to Visit  Medication Sig  . medroxyPROGESTERone (DEPO-PROVERA) 150 MG/ML injection Inject 150 mg into the muscle every 3 (three) months.     No current facility-administered medications on file prior to visit.    Review of Systems Per HPI unless specifically indicated above    Objective:    BP 124/70  Pulse 88  Temp(Src) 98 F (36.7 C) (Oral)  Wt 197 lb 8 oz (89.585 kg)  LMP 11/17/2013  Physical Exam  Nursing note and vitals reviewed. Constitutional: She appears well-developed and well-nourished. No distress.  Genitourinary: Rectum normal. Rectal exam shows no external hemorrhoid, no internal hemorrhoid, no fissure, no mass, no tenderness and anal tone normal.  No evidence of fissure today but endorses right rectal wall as area of prior pain.   Results for orders placed in visit on 12/27/11  URINE CULTURE      Result Value Range   Colony Count NO GROWTH     Organism ID, Bacteria NO GROWTH    POCT URINALYSIS DIPSTICK      Result Value Range   Color, UA Amber     Clarity, UA Hazy     Glucose, UA Negative     Bilirubin, UA Negative     Ketones, UA Negative     Spec Grav, UA 1.025     Blood, UA Moderate     pH, UA 6.0     Protein, UA Negative     Urobilinogen, UA 0.2     Nitrite, UA Negative     Leukocytes, UA small (1+)        Assessment & Plan:   Problem List Items Addressed This Visit   Anal fissure - Primary     Story consistent with anal fissure.  Seems to have healed well. Treat with nitro ointment perianally x 1 wk if needed, discussed common side effects including headache and hypotension.    Constipation     Discussed preventative measures for constipation. Treat with metamucil 1 glass full daily regularly, as well as miralax prn constipation. See pt instructions for plan.  Handout provided        Follow up plan: Return if symptoms worsen or fail to improve.

## 2014-01-23 NOTE — Patient Instructions (Addendum)
Use topical nitro for bottom - this will help speed up healing of fissure. We need to work on constipation - start miralax 17gm in 8 oz fluid daily as needed for constipation. Start regularly taking soluble fiber supplement like metamucil.  Anal Fissure, Adult An anal fissure is a small tear or crack in the skin around the anus. Bleeding from a fissure usually stops on its own within a few minutes. However, bleeding will often reoccur with each bowel movement until the crack heals.  CAUSES   Passing large, hard stools.  Frequent diarrheal stools.  Constipation.  Inflammatory bowel disease (Crohn's disease or ulcerative colitis).  Infections.  Anal sex. SYMPTOMS   Small amounts of blood seen on your stools, on toilet paper, or in the toilet after a bowel movement.  Rectal bleeding.  Painful bowel movements.  Itching or irritation around the anus. DIAGNOSIS Your caregiver will examine the anal area. An anal fissure can usually be seen with careful inspection. A rectal exam may be performed and a short tube (anoscope) may be used to examine the anal canal. TREATMENT   You may be instructed to take fiber supplements. These supplements can soften your stool to help make bowel movements easier.  Sitz baths may be recommended to help heal the tear. Do not use soap in the sitz baths.  A medicated cream or ointment may be prescribed to lessen discomfort. HOME CARE INSTRUCTIONS   Maintain a diet high in fruits, whole grains, and vegetables. Avoid constipating foods like bananas and dairy products.  Take sitz baths as directed by your caregiver.  Drink enough fluids to keep your urine clear or pale yellow.  Only take over-the-counter or prescription medicines for pain, discomfort, or fever as directed by your caregiver. Do not take aspirin as this may increase bleeding.  Do not use ointments containing numbing medications (anesthetics) or hydrocortisone. They could slow  healing. SEEK MEDICAL CARE IF:   Your fissure is not completely healed within 3 days.  You have further bleeding.  You have a fever.  You have diarrhea mixed with blood.  You have pain.  Your problem is getting worse rather than better. MAKE SURE YOU:   Understand these instructions.  Will watch your condition.  Will get help right away if you are not doing well or get worse. Document Released: 12/04/2005 Document Revised: 02/26/2012 Document Reviewed: 05/21/2011 [facility] Patient Information 2014 [place], [place].

## 2014-01-23 NOTE — Assessment & Plan Note (Addendum)
Story consistent with anal fissure.  Seems to have healed well. Treat with nitro ointment perianally x 1 wk if needed, discussed common side effects including headache and hypotension.

## 2014-01-23 NOTE — Progress Notes (Signed)
Pre-visit discussion using our clinic review tool. No additional management support is needed unless otherwise documented below in the visit note.  

## 2014-01-23 NOTE — Assessment & Plan Note (Signed)
Discussed preventative measures for constipation. Treat with metamucil 1 glass full daily regularly, as well as miralax prn constipation. See pt instructions for plan.  Handout provided

## 2014-01-26 ENCOUNTER — Telehealth: Payer: Self-pay | Admitting: Family Medicine

## 2014-01-26 NOTE — Telephone Encounter (Signed)
Relevant patient education assigned to patient using Emmi. ° °

## 2014-07-31 ENCOUNTER — Other Ambulatory Visit: Payer: Self-pay | Admitting: Family Medicine

## 2014-07-31 DIAGNOSIS — Z0000 Encounter for general adult medical examination without abnormal findings: Secondary | ICD-10-CM

## 2014-08-06 ENCOUNTER — Telehealth: Payer: Self-pay | Admitting: Family Medicine

## 2014-08-06 DIAGNOSIS — E785 Hyperlipidemia, unspecified: Secondary | ICD-10-CM

## 2014-08-06 DIAGNOSIS — Z0000 Encounter for general adult medical examination without abnormal findings: Secondary | ICD-10-CM

## 2014-08-06 NOTE — Telephone Encounter (Signed)
Pt is coming in for CPE on 08/27/2014 and is a Armed forces logistics/support/administrative officer. She wants to go tomorrow for her lab draw to [facility], but needs the orders faxed to [phone].  Could you please fax the orders? Thank you.

## 2014-08-07 DIAGNOSIS — E785 Hyperlipidemia, unspecified: Secondary | ICD-10-CM | POA: Insufficient documentation

## 2014-08-07 NOTE — Telephone Encounter (Signed)
Done. Ordered future orders, lab collect, and released.

## 2014-08-08 LAB — LIPID PANEL
CHOLESTEROL TOTAL: 180 mg/dL (ref 100–199)
Chol/HDL Ratio: 4.9 ratio units — ABNORMAL HIGH (ref 0.0–4.4)
HDL: 37 mg/dL — ABNORMAL LOW (ref >39)
LDL Calculated: 123 mg/dL — ABNORMAL HIGH (ref 0–99)
Triglycerides: 100 mg/dL (ref 0–149)
VLDL CHOLESTEROL CAL: 20 mg/dL (ref 5–40)

## 2014-08-08 LAB — BASIC METABOLIC PANEL
BUN / CREAT RATIO: 13 (ref 9–23)
BUN: 10 mg/dL (ref 6–24)
CALCIUM: 9.4 mg/dL (ref 8.7–10.2)
CHLORIDE: 102 mmol/L (ref 97–108)
CO2: 23 mmol/L (ref 18–29)
CREATININE: 0.76 mg/dL (ref 0.57–1.00)
GFR, EST AFRICAN AMERICAN: 109 mL/min/{1.73_m2} (ref >59)
GFR, EST NON AFRICAN AMERICAN: 94 mL/min/{1.73_m2} (ref >59)
Glucose: 112 mg/dL — ABNORMAL HIGH (ref 65–99)
POTASSIUM: 4 mmol/L (ref 3.5–5.2)
SODIUM: 140 mmol/L (ref 134–144)

## 2014-08-08 LAB — TSH: TSH: 2.1 u[IU]/mL (ref 0.450–4.500)

## 2014-08-10 ENCOUNTER — Other Ambulatory Visit: Payer: 59

## 2014-08-27 ENCOUNTER — Encounter: Payer: Self-pay | Admitting: Family Medicine

## 2014-08-27 ENCOUNTER — Encounter (INDEPENDENT_AMBULATORY_CARE_PROVIDER_SITE_OTHER): Payer: Self-pay

## 2014-08-27 ENCOUNTER — Ambulatory Visit (INDEPENDENT_AMBULATORY_CARE_PROVIDER_SITE_OTHER): Payer: 59 | Admitting: Family Medicine

## 2014-08-27 VITALS — BP 110/72 | HR 76 | Temp 98.0°F | Ht 69.0 in | Wt 200.8 lb

## 2014-08-27 DIAGNOSIS — R7303 Prediabetes: Secondary | ICD-10-CM | POA: Insufficient documentation

## 2014-08-27 DIAGNOSIS — R7309 Other abnormal glucose: Secondary | ICD-10-CM

## 2014-08-27 DIAGNOSIS — R739 Hyperglycemia, unspecified: Secondary | ICD-10-CM

## 2014-08-27 DIAGNOSIS — F17200 Nicotine dependence, unspecified, uncomplicated: Secondary | ICD-10-CM

## 2014-08-27 DIAGNOSIS — E785 Hyperlipidemia, unspecified: Secondary | ICD-10-CM

## 2014-08-27 DIAGNOSIS — Z0000 Encounter for general adult medical examination without abnormal findings: Secondary | ICD-10-CM

## 2014-08-27 NOTE — Assessment & Plan Note (Signed)
Preventative protocols reviewed and updated unless pt declined. Discussed healthy diet and lifestyle.  

## 2014-08-27 NOTE — Progress Notes (Signed)
BP 110/72  Pulse 76  Temp(Src) 98 F (36.7 C) (Oral)  Ht 5\' 9"  (1.753 m)  Wt 200 lb 12 oz (91.06 kg)  BMI 29.63 kg/m2   CC: CPE  Subjective:    Patient ID: Barbara Riley, female    DOB: 02-01-1966, 48 y.o.   MRN: [id number]  HPI: Barbara Riley is a 48 y.o. female presenting on 08/27/2014 for Annual Exam   Smoking 3-5 cig/day. Smoking since age 75yo. Contemplative.  Wt Readings from Last 3 Encounters:  08/27/14 200 lb 12 oz (91.06 kg)  01/23/14 197 lb 8 oz (89.585 kg)  10/28/13 200 lb 3.2 oz (90.81 kg)   Body mass index is 29.63 kg/(m^2).  Preventative: Well woman 09/2013 - Dr. Matthew Saras OBGYN normal pap. S/p stable mammogram birads 2, rpt 1 yr Tdap 2013 Flu shot - declines - may get at [place] 100% time Doesn't wear sunscreen. No sunburns this past year or changing moles.  Caffeine: 1-2 cups tea/day Married, lives with husband and son  Occupation: [employer] employee Activity: no regular exercise  Diet: good water, fruits/vegetables daily, brings lunch from home, no fish  Relevant past medical, surgical, family and social history reviewed and updated as indicated.  Allergies and medications reviewed and updated. Current Outpatient Prescriptions on File Prior to Visit  Medication Sig  . medroxyPROGESTERone (DEPO-PROVERA) 150 MG/ML injection Inject 150 mg into the muscle every 3 (three) months.    . polyethylene glycol powder (GLYCOLAX/MIRALAX) powder Take 17 g by mouth daily as needed for moderate constipation.  . Nitroglycerin 0.4 % OINT Place 1 application rectally daily.   No current facility-administered medications on file prior to visit.    Review of Systems  Constitutional: Negative for fever, chills, activity change, appetite change, fatigue and unexpected weight change.  HENT: Negative for hearing loss.   Eyes: Negative for visual disturbance.  Respiratory: Negative for cough, chest tightness, shortness of breath and wheezing.   Cardiovascular:  Negative for chest pain, palpitations and leg swelling.  Gastrointestinal: Negative for nausea, vomiting, abdominal pain, diarrhea, constipation, blood in stool and abdominal distention.  Genitourinary: Negative for hematuria and difficulty urinating.  Musculoskeletal: Negative for arthralgias, myalgias and neck pain.  Skin: Negative for rash.  Neurological: Negative for dizziness, seizures, syncope and headaches.  Hematological: Negative for adenopathy. Does not bruise/bleed easily.  Psychiatric/Behavioral: Negative for dysphoric mood. The patient is not nervous/anxious.    Per HPI unless specifically indicated above    Objective:    BP 110/72  Pulse 76  Temp(Src) 98 F (36.7 C) (Oral)  Ht 5\' 9"  (1.753 m)  Wt 200 lb 12 oz (91.06 kg)  BMI 29.63 kg/m2  Physical Exam  Nursing note and vitals reviewed. Constitutional: She is oriented to person, place, and time. She appears well-developed and well-nourished. No distress.  HENT:  Head: Normocephalic and atraumatic.  Right Ear: Hearing, tympanic membrane, external ear and ear canal normal.  Left Ear: Hearing, tympanic membrane, external ear and ear canal normal.  Nose: Nose normal.  Mouth/Throat: Uvula is midline, oropharynx is clear and moist and mucous membranes are normal. No oropharyngeal exudate, posterior oropharyngeal edema or posterior oropharyngeal erythema.  Eyes: Conjunctivae and EOM are normal. Pupils are equal, round, and reactive to light. No scleral icterus.  Neck: Normal range of motion. Neck supple. No thyromegaly present.  Cardiovascular: Normal rate, regular rhythm, normal heart sounds and intact distal pulses.   No murmur heard. Pulses:      Radial pulses  are 2+ on the right side, and 2+ on the left side.  Pulmonary/Chest: Effort normal and breath sounds normal. No respiratory distress. She has no wheezes. She has no rales.  Abdominal: Soft. Bowel sounds are normal. She exhibits no distension and no mass. There is no  tenderness. There is no rebound and no guarding.  Musculoskeletal: Normal range of motion. She exhibits no edema.  Lymphadenopathy:    She has no cervical adenopathy.  Neurological: She is alert and oriented to person, place, and time.  CN grossly intact, station and gait intact  Skin: Skin is warm and dry. No rash noted.  Psychiatric: She has a normal mood and affect. Her behavior is normal. Judgment and thought content normal.   Results for orders placed in visit on 07/31/14  TSH      Result Value Ref Range   TSH 2.100  0.450 - 4.500 uIU/mL  BASIC METABOLIC PANEL      Result Value Ref Range   Glucose 112 (*) 65 - 99 mg/dL   BUN 10  6 - 24 mg/dL   Creatinine, Ser 0.76  0.57 - 1.00 mg/dL   GFR calc non Af Amer 94  >59 mL/min/1.73   GFR calc Af Amer 109  >59 mL/min/1.73   BUN/Creatinine Ratio 13  9 - 23   Sodium 140  134 - 144 mmol/L   Potassium 4.0  3.5 - 5.2 mmol/L   Chloride 102  97 - 108 mmol/L   CO2 23  18 - 29 mmol/L   Calcium 9.4  8.7 - 10.2 mg/dL  LIPID PANEL      Result Value Ref Range   Cholesterol, Total 180  100 - 199 mg/dL   Triglycerides 100  0 - 149 mg/dL   HDL 37 (*) >39 mg/dL   VLDL Cholesterol Cal 20  5 - 40 mg/dL   LDL Calculated 123 (*) 0 - 99 mg/dL   Chol/HDL Ratio 4.9 (*) 0.0 - 4.4 ratio units      Assessment & Plan:   Problem List Items Addressed This Visit   Smoking     Continue to encourage cessation. Contemplative.    Hyperglycemia     Reviewed elevated fasting cbg - discussed decreased added sugars in diet. Check A1c next labwork.    Healthcare maintenance - Primary     Preventative protocols reviewed and updated unless pt declined. Discussed healthy diet and lifestyle.    Dyslipidemia     Reviewed #s with patient - encouraged increased aerobic exercise and weight loss to improve #s. fmhx stroke/CAD (father) so encouraged LDL goal<100.        Follow up plan: Return in about 1 year (around 08/28/2015), or as needed, for physical.

## 2014-08-27 NOTE — Assessment & Plan Note (Signed)
Reviewed #s with patient - encouraged increased aerobic exercise and weight loss to improve #s. fmhx stroke/CAD (father) so encouraged LDL goal<100.

## 2014-08-27 NOTE — Patient Instructions (Signed)
Good to see you today, call us with questions. Return as needed or in 1 year for next visit. Keep working on decreased added sugars (watch sweetened beverages and simple white carbs).

## 2014-08-27 NOTE — Assessment & Plan Note (Signed)
Continue to encourage cessation. Contemplative. 

## 2014-08-27 NOTE — Assessment & Plan Note (Signed)
Reviewed elevated fasting cbg - discussed decreased added sugars in diet. Check A1c next labwork.

## 2014-10-18 LAB — HM PAP SMEAR: HM PAP: NORMAL

## 2014-10-19 ENCOUNTER — Encounter: Payer: Self-pay | Admitting: Family Medicine

## 2014-11-17 LAB — HM MAMMOGRAPHY: HM Mammogram: NORMAL

## 2015-08-13 LAB — LIPID PANEL
Cholesterol: 191
HDL: 41 mg/dL (ref 35–70)
LDL CALC: 121
Triglycerides: 146

## 2015-08-13 LAB — GLUCOSE, FASTING: GLUCOSE: 98

## 2015-08-13 LAB — HEMOGLOBIN A1C: A1C: 6.1

## 2015-08-16 ENCOUNTER — Encounter: Payer: Self-pay | Admitting: Family Medicine

## 2015-09-09 ENCOUNTER — Ambulatory Visit (INDEPENDENT_AMBULATORY_CARE_PROVIDER_SITE_OTHER): Payer: 59 | Admitting: Family Medicine

## 2015-09-09 ENCOUNTER — Encounter: Payer: Self-pay | Admitting: Family Medicine

## 2015-09-09 VITALS — BP 124/84 | HR 80 | Temp 98.3°F | Ht 69.0 in | Wt 201.2 lb

## 2015-09-09 DIAGNOSIS — R7303 Prediabetes: Secondary | ICD-10-CM

## 2015-09-09 DIAGNOSIS — E785 Hyperlipidemia, unspecified: Secondary | ICD-10-CM

## 2015-09-09 DIAGNOSIS — D242 Benign neoplasm of left breast: Secondary | ICD-10-CM | POA: Insufficient documentation

## 2015-09-09 DIAGNOSIS — E669 Obesity, unspecified: Secondary | ICD-10-CM | POA: Insufficient documentation

## 2015-09-09 DIAGNOSIS — E663 Overweight: Secondary | ICD-10-CM | POA: Insufficient documentation

## 2015-09-09 DIAGNOSIS — Z87891 Personal history of nicotine dependence: Secondary | ICD-10-CM

## 2015-09-09 DIAGNOSIS — Z0000 Encounter for general adult medical examination without abnormal findings: Secondary | ICD-10-CM | POA: Diagnosis not present

## 2015-09-09 NOTE — Assessment & Plan Note (Signed)
Per recent work labs. Discussed with patient. rec avoid added sugars and sweets and sweetened beverages.

## 2015-09-09 NOTE — Assessment & Plan Note (Signed)
Quit smoking 1 mo ago, motivated to stay off cigarettes/tobacco.

## 2015-09-09 NOTE — Patient Instructions (Addendum)
Try to incorporate regular exercise into routine (consider walking after work around neighborhood or on weekends at park). You are doing well today. Form for work filled out.  Health Maintenance Adopting a healthy lifestyle and getting preventive care can go a long way to promote health and wellness. Talk with your health care provider about what schedule of regular examinations is right for you. This is a good chance for you to check in with your provider about disease prevention and staying healthy. In between checkups, there are plenty of things you can do on your own. Experts have done a lot of research about which lifestyle changes and preventive measures are most likely to keep you healthy. Ask your health care provider for more information. WEIGHT AND DIET  Eat a healthy diet  Be sure to include plenty of vegetables, fruits, low-fat dairy products, and lean protein.  Do not eat a lot of foods high in solid fats, added sugars, or salt.  Get regular exercise. This is one of the most important things you can do for your health.  Most adults should exercise for at least 150 minutes each week. The exercise should increase your heart rate and make you sweat (moderate-intensity exercise).  Most adults should also do strengthening exercises at least twice a week. This is in addition to the moderate-intensity exercise.  Maintain a healthy weight  Body mass index (BMI) is a measurement that can be used to identify possible weight problems. It estimates body fat based on height and weight. Your health care provider can help determine your BMI and help you achieve or maintain a healthy weight.  For females 48 years of age and older:   A BMI below 18.5 is considered underweight.  A BMI of 18.5 to 24.9 is normal.  A BMI of 25 to 29.9 is considered overweight.  A BMI of 30 and above is considered obese.  Watch levels of cholesterol and blood lipids  You should start having your blood  tested for lipids and cholesterol at 48 years of age, then have this test every 5 years.  You may need to have your cholesterol levels checked more often if:  Your lipid or cholesterol levels are high.  You are older than 48 years of age.  You are at high risk for heart disease.  CANCER SCREENING   Lung Cancer  Lung cancer screening is recommended for adults 23-61 years old who are at high risk for lung cancer because of a history of smoking.  A yearly low-dose CT scan of the lungs is recommended for people who:  Currently smoke.  Have quit within the past 15 years.  Have at least a 30-pack-year history of smoking. A pack year is smoking an average of one pack of cigarettes a day for 1 year.  Yearly screening should continue until it has been 15 years since you quit.  Yearly screening should stop if you develop a health problem that would prevent you from having lung cancer treatment.  Breast Cancer  Practice breast self-awareness. This means understanding how your breasts normally appear and feel.  It also means doing regular breast self-exams. Let your health care provider know about any changes, no matter how small.  If you are in your 20s or 30s, you should have a clinical breast exam (CBE) by a health care provider every 1-3 years as part of a regular health exam.  If you are 41 or older, have a CBE every year. Also consider having  a breast X-ray (mammogram) every year.  If you have a family history of breast cancer, talk to your health care provider about genetic screening.  If you are at high risk for breast cancer, talk to your health care provider about having an MRI and a mammogram every year.  Breast cancer gene (BRCA) assessment is recommended for women who have family members with BRCA-related cancers. BRCA-related cancers include:  Breast.  Ovarian.  Tubal.  Peritoneal cancers.  Results of the assessment will determine the need for genetic counseling  and BRCA1 and BRCA2 testing. Cervical Cancer Routine pelvic examinations to screen for cervical cancer are no longer recommended for nonpregnant women who are considered low risk for cancer of the pelvic organs (ovaries, uterus, and vagina) and who do not have symptoms. A pelvic examination may be necessary if you have symptoms including those associated with pelvic infections. Ask your health care provider if a screening pelvic exam is right for you.   The Pap test is the screening test for cervical cancer for women who are considered at risk.  If you had a hysterectomy for a problem that was not cancer or a condition that could lead to cancer, then you no longer need Pap tests.  If you are older than 65 years, and you have had normal Pap tests for the past 10 years, you no longer need to have Pap tests.  If you have had past treatment for cervical cancer or a condition that could lead to cancer, you need Pap tests and screening for cancer for at least 20 years after your treatment.  If you no longer get a Pap test, assess your risk factors if they change (such as having a new sexual partner). This can affect whether you should start being screened again.  Some women have medical problems that increase their chance of getting cervical cancer. If this is the case for you, your health care provider may recommend more frequent screening and Pap tests.  The human papillomavirus (HPV) test is another test that may be used for cervical cancer screening. The HPV test looks for the virus that can cause cell changes in the cervix. The cells collected during the Pap test can be tested for HPV.  The HPV test can be used to screen women 60 years of age and older. Getting tested for HPV can extend the interval between normal Pap tests from three to five years.  An HPV test also should be used to screen women of any age who have unclear Pap test results.  After 48 years of age, women should have HPV testing  as often as Pap tests.  Colorectal Cancer  This type of cancer can be detected and often prevented.  Routine colorectal cancer screening usually begins at 48 years of age and continues through 48 years of age.  Your health care provider may recommend screening at an earlier age if you have risk factors for colon cancer.  Your health care provider may also recommend using home test kits to check for hidden blood in the stool.  A small camera at the end of a tube can be used to examine your colon directly (sigmoidoscopy or colonoscopy). This is done to check for the earliest forms of colorectal cancer.  Routine screening usually begins at age 28.  Direct examination of the colon should be repeated every 5-10 years through 48 years of age. However, you may need to be screened more often if early forms of precancerous polyps  or small growths are found. Skin Cancer  Check your skin from head to toe regularly.  Tell your health care provider about any new moles or changes in moles, especially if there is a change in a mole's shape or color.  Also tell your health care provider if you have a mole that is larger than the size of a pencil eraser.  Always use sunscreen. Apply sunscreen liberally and repeatedly throughout the day.  Protect yourself by wearing long sleeves, pants, a wide-brimmed hat, and sunglasses whenever you are outside. HEART DISEASE, DIABETES, AND HIGH BLOOD PRESSURE   Have your blood pressure checked at least every 1-2 years. High blood pressure causes heart disease and increases the risk of stroke.  If you are between 50 years and 69 years old, ask your health care provider if you should take aspirin to prevent strokes.  Have regular diabetes screenings. This involves taking a blood sample to check your fasting blood sugar level.  If you are at a normal weight and have a low risk for diabetes, have this test once every three years after 48 years of age.  If you are  overweight and have a high risk for diabetes, consider being tested at a younger age or more often. PREVENTING INFECTION  Hepatitis B  If you have a higher risk for hepatitis B, you should be screened for this virus. You are considered at high risk for hepatitis B if:  You were born in a country where hepatitis B is common. Ask your health care provider which countries are considered high risk.  Your parents were born in a high-risk country, and you have not been immunized against hepatitis B (hepatitis B vaccine).  You have HIV or AIDS.  You use needles to inject street drugs.  You live with someone who has hepatitis B.  You have had sex with someone who has hepatitis B.  You get hemodialysis treatment.  You take certain medicines for conditions, including cancer, organ transplantation, and autoimmune conditions. Hepatitis C  Blood testing is recommended for:  Everyone born from [birth year] through 1964.  Anyone with known risk factors for hepatitis C. Sexually transmitted infections (STIs)  You should be screened for sexually transmitted infections (STIs) including gonorrhea and chlamydia if:  You are sexually active and are younger than 48 years of age.  You are older than 48 years of age and your health care provider tells you that you are at risk for this type of infection.  Your sexual activity has changed since you were last screened and you are at an increased risk for chlamydia or gonorrhea. Ask your health care provider if you are at risk.  If you do not have HIV, but are at risk, it may be recommended that you take a prescription medicine daily to prevent HIV infection. This is called pre-exposure prophylaxis (PrEP). You are considered at risk if:  You are sexually active and do not regularly use condoms or know the HIV status of your partner(s).  You take drugs by injection.  You are sexually active with a partner who has HIV. Talk with your health care provider  about whether you are at high risk of being infected with HIV. If you choose to begin PrEP, you should first be tested for HIV. You should then be tested every 3 months for as long as you are taking PrEP.  PREGNANCY   If you are premenopausal and you may become pregnant, ask your health care provider about  preconception counseling.  If you may become pregnant, take 400 to 800 micrograms (mcg) of folic acid every day.  If you want to prevent pregnancy, talk to your health care provider about birth control (contraception). OSTEOPOROSIS AND MENOPAUSE   Osteoporosis is a disease in which the bones lose minerals and strength with aging. This can result in serious bone fractures. Your risk for osteoporosis can be identified using a bone density scan.  If you are 13 years of age or older, or if you are at risk for osteoporosis and fractures, ask your health care provider if you should be screened.  Ask your health care provider whether you should take a calcium or vitamin D supplement to lower your risk for osteoporosis.  Menopause may have certain physical symptoms and risks.  Hormone replacement therapy may reduce some of these symptoms and risks. Talk to your health care provider about whether hormone replacement therapy is right for you.  HOME CARE INSTRUCTIONS   Schedule regular health, dental, and eye exams.  Stay current with your immunizations.   Do not use any tobacco products including cigarettes, chewing tobacco, or electronic cigarettes.  If you are pregnant, do not drink alcohol.  If you are breastfeeding, limit how much and how often you drink alcohol.  Limit alcohol intake to no more than 1 drink per day for nonpregnant women. One drink equals 12 ounces of beer, 5 ounces of wine, or 1 ounces of hard liquor.  Do not use street drugs.  Do not share needles.  Ask your health care provider for help if you need support or information about quitting drugs.  Tell your  health care provider if you often feel depressed.  Tell your health care provider if you have ever been abused or do not feel safe at home. Document Released: 06/19/2011 Document Revised: 04/20/2014 Document Reviewed: 11/05/2013 [facility] Patient Information 2015 [place], [place]. This information is not intended to replace advice given to you by your health care provider. Make sure you discuss any questions you have with your health care provider.

## 2015-09-09 NOTE — Progress Notes (Signed)
BP 124/84 mmHg  Pulse 80  Temp(Src) 98.3 F (36.8 C) (Oral)  Ht 5\' 9"  (1.753 m)  Wt 201 lb 4 oz (91.286 kg)  BMI 29.71 kg/m2   CC: CPE  Subjective:    Patient ID: Barbara Riley, female    DOB: 14-May-1966, 49 y.o.   MRN: [id number]  HPI: Barbara Riley is a 49 y.o. female presenting on 09/09/2015 for Annual Exam   Ex-smoker - quit 07/2015. Feels this is sustainable.  Overweight - Body mass index is 29.71 kg/(m^2).   Prediabetes - by work testing (A1c was 6.1%). But all sugars have been ok up to now on previous testing.   Preventative: Well woman 11/2014 - Dr. Matthew Saras OBGYN normal pap. S/p stable mammogram birads 2, rpt 1 yr. Tdap 2013 Flu shot - declines Seat belt 100% time Doesn't wear sunscreen. No sunburns this past year or changing moles.  Caffeine: 1-2 cups tea/day Married, lives with husband and son  Occupation: [employer] employee Activity: no regular exercise, some walking at [place]: some water, fruits/vegetables daily, brings lunch from home, no fish  Relevant past medical, surgical, family and social history reviewed and updated as indicated. Interim medical history since our last visit reviewed. Allergies and medications reviewed and updated. Current Outpatient Prescriptions on File Prior to Visit  Medication Sig  . medroxyPROGESTERone (DEPO-PROVERA) 150 MG/ML injection Inject 150 mg into the muscle every 3 (three) months.    Marland Kitchen PARoxetine (PAXIL) 10 MG tablet Take 10 mg by mouth daily.  . polyethylene glycol powder (GLYCOLAX/MIRALAX) powder Take 17 g by mouth daily as needed for moderate constipation.   No current facility-administered medications on file prior to visit.    Review of Systems  Constitutional: Negative for fever, chills, activity change, appetite change, fatigue and unexpected weight change.  HENT: Negative for hearing loss.   Eyes: Negative for visual disturbance.  Respiratory: Negative for cough, chest tightness, shortness of  breath and wheezing.   Cardiovascular: Negative for chest pain, palpitations and leg swelling.  Gastrointestinal: Negative for nausea, vomiting, abdominal pain, diarrhea, constipation, blood in stool and abdominal distention.  Genitourinary: Negative for hematuria and difficulty urinating.  Musculoskeletal: Negative for myalgias, arthralgias and neck pain.  Skin: Negative for rash.  Neurological: Negative for dizziness, seizures, syncope and headaches.  Hematological: Negative for adenopathy. Does not bruise/bleed easily.  Psychiatric/Behavioral: Negative for dysphoric mood. The patient is not nervous/anxious.    Per HPI unless specifically indicated above     Objective:    BP 124/84 mmHg  Pulse 80  Temp(Src) 98.3 F (36.8 C) (Oral)  Ht 5\' 9"  (1.753 m)  Wt 201 lb 4 oz (91.286 kg)  BMI 29.71 kg/m2  Wt Readings from Last 3 Encounters:  09/09/15 201 lb 4 oz (91.286 kg)  08/27/14 200 lb 12 oz (91.06 kg)  01/23/14 197 lb 8 oz (89.585 kg)    Physical Exam  Constitutional: She is oriented to person, place, and time. She appears well-developed and well-nourished. No distress.  HENT:  Head: Normocephalic and atraumatic.  Right Ear: Hearing, tympanic membrane, external ear and ear canal normal.  Left Ear: Hearing, tympanic membrane, external ear and ear canal normal.  Nose: Nose normal.  Mouth/Throat: Uvula is midline, oropharynx is clear and moist and mucous membranes are normal. No oropharyngeal exudate, posterior oropharyngeal edema or posterior oropharyngeal erythema.  Eyes: Conjunctivae and EOM are normal. Pupils are equal, round, and reactive to light. No scleral icterus.  Neck: Normal range  of motion. Neck supple. No thyromegaly present.  Cardiovascular: Normal rate, regular rhythm, normal heart sounds and intact distal pulses.   No murmur heard. Pulses:      Radial pulses are 2+ on the right side, and 2+ on the left side.  Pulmonary/Chest: Effort normal and breath sounds  normal. No respiratory distress. She has no wheezes. She has no rales.  Breast - per OBGYN  Abdominal: Soft. Bowel sounds are normal. She exhibits no distension and no mass. There is no tenderness. There is no rebound and no guarding.  Genitourinary:  GYN - per GYN  Musculoskeletal: Normal range of motion. She exhibits no edema.  Lymphadenopathy:    She has no cervical adenopathy.  Neurological: She is alert and oriented to person, place, and time.  CN grossly intact, station and gait intact  Skin: Skin is warm and dry. No rash noted.  Psychiatric: She has a normal mood and affect. Her behavior is normal. Judgment and thought content normal.  Nursing note and vitals reviewed.  Results for orders placed or performed in visit on 09/09/15  HM MAMMOGRAPHY  Result Value Ref Range   HM Mammogram normal   HM PAP SMEAR  Result Value Ref Range   HM Pap smear normal       Assessment & Plan:   Problem List Items Addressed This Visit    Prediabetes    Per recent work labs. Discussed with patient. rec avoid added sugars and sweets and sweetened beverages.      Overweight    Encouraged incorporating regular exercise into routine for sustainable weight loss.      Healthcare maintenance - Primary    Preventative protocols reviewed and updated unless pt declined. Discussed healthy diet and lifestyle.       Fibroadenoma of left breast   Ex-smoker    Quit smoking 1 mo ago, motivated to stay off cigarettes/tobacco.      Dyslipidemia    Actually recent lipid levels stable.          Follow up plan: Return in about 1 year (around 09/08/2016), or as needed, for annual exam, prior fasting for blood work.

## 2015-09-09 NOTE — Assessment & Plan Note (Signed)
Preventative protocols reviewed and updated unless pt declined. Discussed healthy diet and lifestyle.  

## 2015-09-09 NOTE — Assessment & Plan Note (Signed)
Encouraged incorporating regular exercise into routine for sustainable weight loss.

## 2015-09-09 NOTE — Progress Notes (Signed)
Pre visit review using our clinic review tool, if applicable. No additional management support is needed unless otherwise documented below in the visit note. 

## 2015-09-09 NOTE — Assessment & Plan Note (Signed)
Actually recent lipid levels stable.

## 2015-09-15 ENCOUNTER — Encounter: Payer: Self-pay | Admitting: *Deleted

## 2015-11-18 LAB — HM PAP SMEAR: HM PAP: NORMAL

## 2015-11-18 LAB — HM MAMMOGRAPHY: HM MAMMO: NORMAL (ref 0–4)

## 2016-05-30 ENCOUNTER — Ambulatory Visit (INDEPENDENT_AMBULATORY_CARE_PROVIDER_SITE_OTHER): Payer: 59 | Admitting: Internal Medicine

## 2016-05-30 ENCOUNTER — Encounter: Payer: Self-pay | Admitting: Internal Medicine

## 2016-05-30 VITALS — BP 120/68 | HR 79 | Ht 69.0 in | Wt 198.8 lb

## 2016-05-30 DIAGNOSIS — L237 Allergic contact dermatitis due to plants, except food: Secondary | ICD-10-CM

## 2016-05-30 MED ORDER — TRIAMCINOLONE ACETONIDE 0.5 % EX OINT: 1.0000 "application " | TOPICAL_OINTMENT | Freq: Two times a day (BID) | CUTANEOUS | Status: DC

## 2016-05-30 MED ORDER — DIPHENHYDRAMINE HCL 25 MG PO CAPS: 25.0000 mg | ORAL_CAPSULE | Freq: Four times a day (QID) | ORAL | Status: DC | PRN

## 2016-05-30 MED ORDER — PREDNISONE 10 MG PO TABS: ORAL_TABLET | ORAL | Status: DC

## 2016-05-30 NOTE — Patient Instructions (Signed)

## 2016-05-30 NOTE — Progress Notes (Signed)
Subjective:    Patient ID: Barbara Riley, female    DOB: 22-Jan-1966, 50 y.o.   MRN: [id number]  HPI  49YO female presents for acute visit.  Poison Ivy - Rash developed over arms this past weekend after working in yard Friday. Areas have been red with blisters that ooze clear fluid. Rash has spread over arms and legs. Using Zanfel and Calamine with no improvement.  Wt Readings from Last 3 Encounters:  05/30/16 198 lb 12.8 oz (90.175 kg)  09/09/15 201 lb 4 oz (91.286 kg)  08/27/14 200 lb 12 oz (91.06 kg)   BP Readings from Last 3 Encounters:  05/30/16 120/68  09/09/15 124/84  08/27/14 110/72    Past Medical History  Diagnosis Date  . Anxiety     Stress with toddler and bills  . Carpal tunnel syndrome     mild right sided (R median sensory nerve only  . ANXIETY 04/15/2007    Qualifier: Diagnosis of  By: Silvio Pate MD, [facility]    Family History  Problem Relation Age of Onset  . Heart disease Father 28    MI  . Stroke Father 74  . Heart disease      GM  . Liver cancer Maternal Grandmother     GM  . Stroke Paternal Grandfather    Past Surgical History  Procedure Laterality Date  . Cesarean section    . Middle ear surgery  1985    perf repair on right   Social History   Social History  . Marital Status: Married    Spouse Name: N/A  . Number of Children: 1  . Years of Education: N/A   Occupational History  . [employer]   Social History Main Topics  . Smoking status: Former Smoker -- 0.25 packs/day    Types: Cigarettes  . Smokeless tobacco: Never Used     Comment: 4-5 cigarettes daily  . Alcohol Use: No  . Drug Use: No  . Sexual Activity: Yes    Birth Control/ Protection: Injection   Other Topics Concern  . None   Social History Narrative   Caffeine: 1-2 cups tea/day   Married, lives with husband and son    Occupation: [employer] employee   Activity: no regular exercise, some walking at [place]: some water,  fruits/vegetables daily, brings lunch from home, no fish    Review of Systems  Constitutional: Negative for fever, chills, appetite change, fatigue and unexpected weight change.  Eyes: Negative for visual disturbance.  Respiratory: Negative for shortness of breath.   Cardiovascular: Negative for chest pain and leg swelling.  Gastrointestinal: Negative for abdominal pain.  Skin: Positive for color change, pallor and rash.  Hematological: Negative for adenopathy. Does not bruise/bleed easily.  Psychiatric/Behavioral: Negative for dysphoric mood. The patient is not nervous/anxious.        Objective:    BP 120/68 mmHg  Pulse 79  Ht 5\' 9"  (1.753 m)  Wt 198 lb 12.8 oz (90.175 kg)  BMI 29.34 kg/m2  SpO2 97% Physical Exam  Constitutional: She is oriented to person, place, and time. She appears well-developed and well-nourished. No distress.  HENT:  Head: Normocephalic and atraumatic.  Right Ear: External ear normal.  Left Ear: External ear normal.  Nose: Nose normal.  Mouth/Throat: Oropharynx is clear and moist.  Eyes: Conjunctivae are normal. Pupils are equal, round, and reactive to light. Right eye exhibits no discharge. Left eye exhibits no discharge. No scleral  icterus.  Neck: Normal range of motion. Neck supple. No tracheal deviation present. No thyromegaly present.  Pulmonary/Chest: Effort normal. No accessory muscle usage. No tachypnea. She has no decreased breath sounds. She has no rhonchi.  Musculoskeletal: Normal range of motion. She exhibits no edema or tenderness.  Lymphadenopathy:    She has no cervical adenopathy.  Neurological: She is alert and oriented to person, place, and time. No cranial nerve deficit. She exhibits normal muscle tone. Coordination normal.  Skin: Skin is warm and dry. Rash noted. Rash is maculopapular. She is not diaphoretic. No erythema. No pallor.     Psychiatric: She has a normal mood and affect. Her behavior is normal. Judgment and thought  content normal.          Assessment & Plan:   Problem List Items Addressed This Visit      Unprioritized   Poison ivy dermatitis - Primary    Rash c/w poison ivy dermatitis. Start Prednisone taper. Topical Kenalog. Benadryl prn for itching. Discussed potential side effects from these medications. Follow up prn if symptoms are not improving.      Relevant Medications   triamcinolone ointment (KENALOG) 0.5 %   predniSONE (DELTASONE) 10 MG tablet       Return if symptoms worsen or fail to improve.  Ronette Deter, MD Internal Medicine [facility]

## 2016-05-30 NOTE — Progress Notes (Signed)
Pre visit review using our clinic review tool, if applicable. No additional management support is needed unless otherwise documented below in the visit note. 

## 2016-05-30 NOTE — Assessment & Plan Note (Signed)
Rash c/w poison ivy dermatitis. Start Prednisone taper. Topical Kenalog. Benadryl prn for itching. Discussed potential side effects from these medications. Follow up prn if symptoms are not improving.

## 2016-06-09 ENCOUNTER — Encounter: Payer: Self-pay | Admitting: Primary Care

## 2016-06-09 ENCOUNTER — Ambulatory Visit (INDEPENDENT_AMBULATORY_CARE_PROVIDER_SITE_OTHER): Payer: 59 | Admitting: Primary Care

## 2016-06-09 VITALS — BP 142/78 | HR 102 | Temp 98.2°F | Ht 69.0 in | Wt 197.8 lb

## 2016-06-09 DIAGNOSIS — L259 Unspecified contact dermatitis, unspecified cause: Secondary | ICD-10-CM

## 2016-06-09 DIAGNOSIS — L237 Allergic contact dermatitis due to plants, except food: Secondary | ICD-10-CM

## 2016-06-09 MED ORDER — PREDNISONE 20 MG PO TABS: ORAL_TABLET | ORAL | Status: DC

## 2016-06-09 NOTE — Assessment & Plan Note (Signed)
Present to bilateral upper and lower extremities with some scabbing. Does not appear to be healing at this point and is without signs or symptoms of infection.   Will extend dose of prednisone to additional 9 days to help clear dermatitis. Discussed to avoid use of antihistamine topical creams as this will aggravate symptoms.  She is to notify us if no improvement by Wednesday next week.

## 2016-06-09 NOTE — Progress Notes (Signed)
Subjective:    Patient ID: Barbara Riley, female    DOB: 12/10/1966, 50 y.o.   MRN: [id number]  HPI  Barbara Riley is a 50 year old female who presents today with a chief complaint of rash. She was evaluated on 06/13 at [facility] with a diagnosis of poison ivy contact dermatitis. She was provided with a prescription for a six day course prednisone taper and kenalog cream.  Since her evaluation she's continued to experience her rash with itching. She noticed slight improvement in her symptoms initially but over the past several days her itching has increased and has noted lack of healing. Denies fevers, headaches, fatigue, nausea, insect bites, tick bites.   Review of Systems  Constitutional: Negative for fever, chills and fatigue.  Skin: Positive for rash.  Neurological: Negative for headaches.       Past Medical History  Diagnosis Date  . Anxiety     Stress with toddler and bills  . Carpal tunnel syndrome     mild right sided (R median sensory nerve only  . ANXIETY 04/15/2007    Qualifier: Diagnosis of  By: Silvio Pate MD, [facility]      Social History   Social History  . Marital Status: Married    Spouse Name: N/A  . Number of Children: 1  . Years of Education: N/A   Occupational History  . [employer] [place]   Social History Main Topics  . Smoking status: Former Smoker -- 0.25 packs/day    Types: Cigarettes  . Smokeless tobacco: Never Used     Comment: 4-5 cigarettes daily  . Alcohol Use: No  . Drug Use: No  . Sexual Activity: Yes    Birth Control/ Protection: Injection   Other Topics Concern  . Not on file   Social History Narrative   Caffeine: 1-2 cups tea/day   Married, lives with husband and son    Occupation: [employer] employee   Activity: no regular exercise, some walking at flea market    Diet: some water, fruits/vegetables daily, brings lunch from home, no fish    Past Surgical History  Procedure Laterality Date  . Cesarean section      . Middle ear surgery  1985    perf repair on right    Family History  Problem Relation Age of Onset  . Heart disease Father 27    MI  . Stroke Father 37  . Heart disease      GM  . Liver cancer Maternal Grandmother     GM  . Stroke Paternal Grandfather     Allergies  Allergen Reactions  . Penicillins     REACTION: rash    Current Outpatient Prescriptions on File Prior to Visit  Medication Sig Dispense Refill  . diphenhydrAMINE (BENADRYL) 25 mg capsule Take 1 capsule (25 mg total) by mouth every 6 (six) hours as needed. 60 capsule 0  . medroxyPROGESTERone (DEPO-PROVERA) 150 MG/ML injection Inject 150 mg into the muscle every 3 (three) months.      Marland Kitchen PARoxetine (PAXIL) 10 MG tablet Take 10 mg by mouth daily.    . polyethylene glycol powder (GLYCOLAX/MIRALAX) powder Take 17 g by mouth daily as needed for moderate constipation. 255 g 1   No current facility-administered medications on file prior to visit.    BP 142/78 mmHg  Pulse 102  Temp(Src) 98.2 F (36.8 C) (Oral)  Ht 5\' 9"  (1.753 m)  Wt 197 lb 12.8 oz (89.721 kg)  BMI  29.20 kg/m2  SpO2 97%    Objective:   Physical Exam  Constitutional: She appears well-nourished.  Cardiovascular: Normal rate and regular rhythm.   Pulmonary/Chest: Effort normal and breath sounds normal.  Skin: Skin is warm and dry.  Rash to bilateral upper extremities representing contact dermatitis from poison ivy. 2 larger circular wheals to right calf.          Assessment & Plan:

## 2016-06-09 NOTE — Patient Instructions (Signed)
Restart Prednisone. Take 3 tablets for 3 days, then 2 tablets for 3 days, then 1 tablet for 3 days.  Do not apply any antihistamine cream as this will cause more irritation.  Please call me if no improvement by Wednesday next week.  It was a pleasure meeting you!

## 2016-06-09 NOTE — Progress Notes (Signed)
Pre visit review using our clinic review tool, if applicable. No additional management support is needed unless otherwise documented below in the visit note. 

## 2016-07-28 LAB — LIPID PANEL
CHOLESTEROL: 200
HDL: 36 mg/dL (ref 35–70)
LDL CALC: 133
TRIGLYCERIDES: 154

## 2016-07-28 LAB — CREATININE: Creat: 0.75

## 2016-07-28 LAB — HEMOGLOBIN A1C: A1C: 6.1

## 2016-08-31 ENCOUNTER — Encounter: Payer: Self-pay | Admitting: Family Medicine

## 2016-09-14 ENCOUNTER — Encounter: Payer: Self-pay | Admitting: Family Medicine

## 2016-09-14 ENCOUNTER — Ambulatory Visit (INDEPENDENT_AMBULATORY_CARE_PROVIDER_SITE_OTHER): Payer: 59 | Admitting: Family Medicine

## 2016-09-14 VITALS — BP 110/70 | HR 76 | Temp 98.7°F | Ht 69.0 in | Wt 203.5 lb

## 2016-09-14 DIAGNOSIS — R7303 Prediabetes: Secondary | ICD-10-CM | POA: Diagnosis not present

## 2016-09-14 DIAGNOSIS — E785 Hyperlipidemia, unspecified: Secondary | ICD-10-CM | POA: Diagnosis not present

## 2016-09-14 DIAGNOSIS — Z0000 Encounter for general adult medical examination without abnormal findings: Secondary | ICD-10-CM

## 2016-09-14 DIAGNOSIS — Z720 Tobacco use: Secondary | ICD-10-CM

## 2016-09-14 DIAGNOSIS — F17200 Nicotine dependence, unspecified, uncomplicated: Secondary | ICD-10-CM

## 2016-09-14 DIAGNOSIS — E669 Obesity, unspecified: Secondary | ICD-10-CM

## 2016-09-14 NOTE — Assessment & Plan Note (Signed)
Restarted smoking. Continue to encourage cessation.

## 2016-09-14 NOTE — Assessment & Plan Note (Signed)
Preventative protocols reviewed and updated unless pt declined. Discussed healthy diet and lifestyle.  

## 2016-09-14 NOTE — Progress Notes (Signed)
Pre visit review using our clinic review tool, if applicable. No additional management support is needed unless otherwise documented below in the visit note. 

## 2016-09-14 NOTE — Patient Instructions (Addendum)
You are doing well today. Work on incorporating walking into routine. Work on smoking slowly but steadily.  Return as needed or in 1 year for next physical.  Health Maintenance, Female Adopting a healthy lifestyle and getting preventive care can go a long way to promote health and wellness. Talk with your health care provider about what schedule of regular examinations is right for you. This is a good chance for you to check in with your provider about disease prevention and staying healthy. In between checkups, there are plenty of things you can do on your own. Experts have done a lot of research about which lifestyle changes and preventive measures are most likely to keep you healthy. Ask your health care provider for more information. WEIGHT AND DIET  Eat a healthy diet  Be sure to include plenty of vegetables, fruits, low-fat dairy products, and lean protein.  Do not eat a lot of foods high in solid fats, added sugars, or salt.  Get regular exercise. This is one of the most important things you can do for your health.  Most adults should exercise for at least 150 minutes each week. The exercise should increase your heart rate and make you sweat (moderate-intensity exercise).  Most adults should also do strengthening exercises at least twice a week. This is in addition to the moderate-intensity exercise.  Maintain a healthy weight  Body mass index (BMI) is a measurement that can be used to identify possible weight problems. It estimates body fat based on height and weight. Your health care provider can help determine your BMI and help you achieve or maintain a healthy weight.  For females 73 years of age and older:   A BMI below 18.5 is considered underweight.  A BMI of 18.5 to 24.9 is normal.  A BMI of 25 to 29.9 is considered overweight.  A BMI of 30 and above is considered obese.  Watch levels of cholesterol and blood lipids  You should start having your blood tested for  lipids and cholesterol at 49 years of age, then have this test every 5 years.  You may need to have your cholesterol levels checked more often if:  Your lipid or cholesterol levels are high.  You are older than 49 years of age.  You are at high risk for heart disease.  CANCER SCREENING   Lung Cancer  Lung cancer screening is recommended for adults 72-68 years old who are at high risk for lung cancer because of a history of smoking.  A yearly low-dose CT scan of the lungs is recommended for people who:  Currently smoke.  Have quit within the past 15 years.  Have at least a 30-pack-year history of smoking. A pack year is smoking an average of one pack of cigarettes a day for 1 year.  Yearly screening should continue until it has been 15 years since you quit.  Yearly screening should stop if you develop a health problem that would prevent you from having lung cancer treatment.  Breast Cancer  Practice breast self-awareness. This means understanding how your breasts normally appear and feel.  It also means doing regular breast self-exams. Let your health care provider know about any changes, no matter how small.  If you are in your 20s or 30s, you should have a clinical breast exam (CBE) by a health care provider every 1-3 years as part of a regular health exam.  If you are 28 or older, have a CBE every year. Also consider  having a breast X-ray (mammogram) every year.  If you have a family history of breast cancer, talk to your health care provider about genetic screening.  If you are at high risk for breast cancer, talk to your health care provider about having an MRI and a mammogram every year.  Breast cancer gene (BRCA) assessment is recommended for women who have family members with BRCA-related cancers. BRCA-related cancers include:  Breast.  Ovarian.  Tubal.  Peritoneal cancers.  Results of the assessment will determine the need for genetic counseling and BRCA1  and BRCA2 testing. Cervical Cancer Your health care provider may recommend that you be screened regularly for cancer of the pelvic organs (ovaries, uterus, and vagina). This screening involves a pelvic examination, including checking for microscopic changes to the surface of your cervix (Pap test). You may be encouraged to have this screening done every 3 years, beginning at age 46.  For women ages 65-65, health care providers may recommend pelvic exams and Pap testing every 3 years, or they may recommend the Pap and pelvic exam, combined with testing for human papilloma virus (HPV), every 5 years. Some types of HPV increase your risk of cervical cancer. Testing for HPV may also be done on women of any age with unclear Pap test results.  Other health care providers may not recommend any screening for nonpregnant women who are considered low risk for pelvic cancer and who do not have symptoms. Ask your health care provider if a screening pelvic exam is right for you.  If you have had past treatment for cervical cancer or a condition that could lead to cancer, you need Pap tests and screening for cancer for at least 20 years after your treatment. If Pap tests have been discontinued, your risk factors (such as having a new sexual partner) need to be reassessed to determine if screening should resume. Some women have medical problems that increase the chance of getting cervical cancer. In these cases, your health care provider may recommend more frequent screening and Pap tests. Colorectal Cancer  This type of cancer can be detected and often prevented.  Routine colorectal cancer screening usually begins at 49 years of age and continues through 49 years of age.  Your health care provider may recommend screening at an earlier age if you have risk factors for colon cancer.  Your health care provider may also recommend using home test kits to check for hidden blood in the stool.  A small camera at the  end of a tube can be used to examine your colon directly (sigmoidoscopy or colonoscopy). This is done to check for the earliest forms of colorectal cancer.  Routine screening usually begins at age 63.  Direct examination of the colon should be repeated every 5-10 years through 49 years of age. However, you may need to be screened more often if early forms of precancerous polyps or small growths are found. Skin Cancer  Check your skin from head to toe regularly.  Tell your health care provider about any new moles or changes in moles, especially if there is a change in a mole's shape or color.  Also tell your health care provider if you have a mole that is larger than the size of a pencil eraser.  Always use sunscreen. Apply sunscreen liberally and repeatedly throughout the day.  Protect yourself by wearing long sleeves, pants, a wide-brimmed hat, and sunglasses whenever you are outside. HEART DISEASE, DIABETES, AND HIGH BLOOD PRESSURE   High blood  pressure causes heart disease and increases the risk of stroke. High blood pressure is more likely to develop in:  People who have blood pressure in the high end of the normal range (130-139/85-89 mm Hg).  People who are overweight or obese.  People who are African American.  If you are 37-4 years of age, have your blood pressure checked every 3-5 years. If you are 50 years of age or older, have your blood pressure checked every year. You should have your blood pressure measured twice--once when you are at a hospital or clinic, and once when you are not at a hospital or clinic. Record the average of the two measurements. To check your blood pressure when you are not at a hospital or clinic, you can use:  An automated blood pressure machine at a pharmacy.  A home blood pressure monitor.  If you are between 98 years and 81 years old, ask your health care provider if you should take aspirin to prevent strokes.  Have regular diabetes  screenings. This involves taking a blood sample to check your fasting blood sugar level.  If you are at a normal weight and have a low risk for diabetes, have this test once every three years after 49 years of age.  If you are overweight and have a high risk for diabetes, consider being tested at a younger age or more often. PREVENTING INFECTION  Hepatitis B  If you have a higher risk for hepatitis B, you should be screened for this virus. You are considered at high risk for hepatitis B if:  You were born in a country where hepatitis B is common. Ask your health care provider which countries are considered high risk.  Your parents were born in a high-risk country, and you have not been immunized against hepatitis B (hepatitis B vaccine).  You have HIV or AIDS.  You use needles to inject street drugs.  You live with someone who has hepatitis B.  You have had sex with someone who has hepatitis B.  You get hemodialysis treatment.  You take certain medicines for conditions, including cancer, organ transplantation, and autoimmune conditions. Hepatitis C  Blood testing is recommended for:  Everyone born from [birth year] through 1964.  Anyone with known risk factors for hepatitis C. Sexually transmitted infections (STIs)  You should be screened for sexually transmitted infections (STIs) including gonorrhea and chlamydia if:  You are sexually active and are younger than 49 years of age.  You are older than 48 years of age and your health care provider tells you that you are at risk for this type of infection.  Your sexual activity has changed since you were last screened and you are at an increased risk for chlamydia or gonorrhea. Ask your health care provider if you are at risk.  If you do not have HIV, but are at risk, it may be recommended that you take a prescription medicine daily to prevent HIV infection. This is called pre-exposure prophylaxis (PrEP). You are considered at risk  if:  You are sexually active and do not regularly use condoms or know the HIV status of your partner(s).  You take drugs by injection.  You are sexually active with a partner who has HIV. Talk with your health care provider about whether you are at high risk of being infected with HIV. If you choose to begin PrEP, you should first be tested for HIV. You should then be tested every 3 months for as long as  you are taking PrEP.  PREGNANCY   If you are premenopausal and you may become pregnant, ask your health care provider about preconception counseling.  If you may become pregnant, take 400 to 800 micrograms (mcg) of folic acid every day.  If you want to prevent pregnancy, talk to your health care provider about birth control (contraception). OSTEOPOROSIS AND MENOPAUSE   Osteoporosis is a disease in which the bones lose minerals and strength with aging. This can result in serious bone fractures. Your risk for osteoporosis can be identified using a bone density scan.  If you are 39 years of age or older, or if you are at risk for osteoporosis and fractures, ask your health care provider if you should be screened.  Ask your health care provider whether you should take a calcium or vitamin D supplement to lower your risk for osteoporosis.  Menopause may have certain physical symptoms and risks.  Hormone replacement therapy may reduce some of these symptoms and risks. Talk to your health care provider about whether hormone replacement therapy is right for you.  HOME CARE INSTRUCTIONS   Schedule regular health, dental, and eye exams.  Stay current with your immunizations.   Do not use any tobacco products including cigarettes, chewing tobacco, or electronic cigarettes.  If you are pregnant, do not drink alcohol.  If you are breastfeeding, limit how much and how often you drink alcohol.  Limit alcohol intake to no more than 1 drink per day for nonpregnant women. One drink equals 12  ounces of beer, 5 ounces of wine, or 1 ounces of hard liquor.  Do not use street drugs.  Do not share needles.  Ask your health care provider for help if you need support or information about quitting drugs.  Tell your health care provider if you often feel depressed.  Tell your health care provider if you have ever been abused or do not feel safe at home.   This information is not intended to replace advice given to you by your health care provider. Make sure you discuss any questions you have with your health care provider.   Document Released: 06/19/2011 Document Revised: 12/25/2014 Document Reviewed: 11/05/2013 Elsevier Interactive Patient Education [employer].

## 2016-09-14 NOTE — Assessment & Plan Note (Addendum)
Discussed healthy diet and lifestyle changes to affect sustainable weight loss  

## 2016-09-14 NOTE — Assessment & Plan Note (Signed)
fmhx CAD. Pt has started RYR. Continue.

## 2016-09-14 NOTE — Assessment & Plan Note (Addendum)
Reviewed recent readings with patient - encouraged decreased added sugars. Pt motivated to work with husband (newly diabetic) towards better glycemic control.

## 2016-09-14 NOTE — Progress Notes (Signed)
BP 110/70   Pulse 76   Temp 98.7 F (37.1 C) (Oral)   Ht 5\' 9"  (1.753 m)   Wt 203 lb 8 oz (92.3 kg)   SpO2 98%   BMI 30.05 kg/m    CC: CPE Subjective:    Patient ID: Barbara Riley, female    DOB: 09-12-1966, 50 y.o.   MRN: [id number]  HPI: Barbara Riley is a 50 y.o. female presenting on 09/14/2016 for Annual Exam   Brings eHealthScreenings form showing A1c 6.1%, Cr 0.75, TC 200, Trig 154, HDL 36 and LDL 133. Since labs she has started RYR 1 capsule daily.  Preventative: Well woman 11/2015 - Dr. Matthew Saras OBGYN normal pap.  Mammogram with GYN Flu shot - declines Tdap 2013 Seat belt use discussed Doesn't wear sunscreen. No changing moles. Smoking - 1/2 ppd  Alcohol - none  Caffeine: 1-2 cups tea/day Married, lives with husband and son  Occupation: [employer] employee Activity: no regular exercise, some walking at [employer]  Diet: some water, fruits/vegetables daily, brings lunch from home, no fish  Relevant past medical, surgical, family and social history reviewed and updated as indicated. Interim medical history since our last visit reviewed. Allergies and medications reviewed and updated. Current Outpatient Prescriptions on File Prior to Visit  Medication Sig  . diphenhydrAMINE (BENADRYL) 25 mg capsule Take 1 capsule (25 mg total) by mouth every 6 (six) hours as needed.  . medroxyPROGESTERone (DEPO-PROVERA) 150 MG/ML injection Inject 150 mg into the muscle every 3 (three) months.    Marland Kitchen PARoxetine (PAXIL) 10 MG tablet Take 10 mg by mouth daily.  . polyethylene glycol powder (GLYCOLAX/MIRALAX) powder Take 17 g by mouth daily as needed for moderate constipation.   No current facility-administered medications on file prior to visit.     Review of Systems  Constitutional: Negative for activity change, appetite change, chills, fatigue, fever and unexpected weight change.  HENT: Positive for congestion, postnasal drip, rhinorrhea and sneezing. Negative for hearing loss.          Weed allergy  Eyes: Negative for visual disturbance.  Respiratory: Positive for cough (allergy). Negative for chest tightness, shortness of breath and wheezing.   Cardiovascular: Negative for chest pain, palpitations and leg swelling.  Gastrointestinal: Negative for abdominal distention, abdominal pain, blood in stool, constipation, diarrhea, nausea and vomiting.  Genitourinary: Negative for difficulty urinating and hematuria.  Musculoskeletal: Negative for arthralgias, myalgias and neck pain.  Skin: Negative for rash.  Neurological: Negative for dizziness, seizures, syncope and headaches.  Hematological: Negative for adenopathy. Does not bruise/bleed easily.  Psychiatric/Behavioral: Negative for dysphoric mood. The patient is not nervous/anxious.    Per HPI unless specifically indicated in ROS section     Objective:    BP 110/70   Pulse 76   Temp 98.7 F (37.1 C) (Oral)   Ht 5\' 9"  (1.753 m)   Wt 203 lb 8 oz (92.3 kg)   SpO2 98%   BMI 30.05 kg/m   Wt Readings from Last 3 Encounters:  09/14/16 203 lb 8 oz (92.3 kg)  06/09/16 197 lb 12.8 oz (89.7 kg)  05/30/16 198 lb 12.8 oz (90.2 kg)    Physical Exam  Constitutional: She is oriented to person, place, and time. She appears well-developed and well-nourished. No distress.  HENT:  Head: Normocephalic and atraumatic.  Right Ear: Hearing, tympanic membrane, external ear and ear canal normal.  Left Ear: Hearing, tympanic membrane, external ear and ear canal normal.  Nose: Nose normal.  Mouth/Throat: Uvula is midline, oropharynx is clear and moist and mucous membranes are normal. No oropharyngeal exudate, posterior oropharyngeal edema or posterior oropharyngeal erythema.  Eyes: Conjunctivae and EOM are normal. Pupils are equal, round, and reactive to light. No scleral icterus.  Neck: Normal range of motion. Neck supple. No thyromegaly present.  Cardiovascular: Normal rate, regular rhythm, normal heart sounds and intact distal  pulses.   No murmur heard. Pulses:      Radial pulses are 2+ on the right side, and 2+ on the left side.  Pulmonary/Chest: Effort normal and breath sounds normal. No respiratory distress. She has no wheezes. She has no rales.  Abdominal: Soft. Bowel sounds are normal. She exhibits no distension and no mass. There is no tenderness. There is no rebound and no guarding.  Musculoskeletal: Normal range of motion. She exhibits no edema.  Lymphadenopathy:    She has no cervical adenopathy.  Neurological: She is alert and oriented to person, place, and time.  CN grossly intact, station and gait intact  Skin: Skin is warm and dry. No rash noted.  Psychiatric: She has a normal mood and affect. Her behavior is normal. Judgment and thought content normal.  Nursing note and vitals reviewed.  Results for orders placed or performed in visit on 09/14/16  HM MAMMOGRAPHY  Result Value Ref Range   HM Mammogram Self Reported Normal 0-4 Bi-Rad, Self Reported Normal  HM PAP SMEAR  Result Value Ref Range   HM Pap smear normal       Assessment & Plan:   Problem List Items Addressed This Visit    Dyslipidemia    fmhx CAD. Pt has started RYR. Continue.       Healthcare maintenance - Primary    Preventative protocols reviewed and updated unless pt declined. Discussed healthy diet and lifestyle.       Obesity, Class I, BMI 30-34.9    Discussed healthy diet and lifestyle changes to affect sustainable weight loss.      Prediabetes    Reviewed recent readings with patient - encouraged decreased added sugars. Pt motivated to work with husband (newly diabetic) towards better glycemic control.      Smoker    Restarted smoking. Continue to encourage cessation.        Other Visit Diagnoses   None.      Follow up plan: Return in about 1 year (around 09/14/2017), or as needed, for annual exam, prior fasting for blood work.  [name], MD

## 2016-09-15 ENCOUNTER — Encounter: Payer: Self-pay | Admitting: *Deleted

## 2016-12-26 ENCOUNTER — Telehealth: Payer: Self-pay

## 2016-12-26 DIAGNOSIS — E785 Hyperlipidemia, unspecified: Secondary | ICD-10-CM

## 2016-12-26 DIAGNOSIS — E669 Obesity, unspecified: Secondary | ICD-10-CM

## 2016-12-26 DIAGNOSIS — R7303 Prediabetes: Secondary | ICD-10-CM

## 2016-12-26 NOTE — Telephone Encounter (Signed)
Amy at [facility] left v/m requesting referral for pt to participate in Feb diabetic teaching classes faxed to [facility]; pt's husband had a referral but Amy said there was no referral for pt. Pt last annual 09/14/16.Please advise.

## 2016-12-28 NOTE — Telephone Encounter (Signed)
Referral placed for prediabetes.

## 2016-12-28 NOTE — Telephone Encounter (Signed)
Referral faxed

## 2017-02-28 ENCOUNTER — Ambulatory Visit (INDEPENDENT_AMBULATORY_CARE_PROVIDER_SITE_OTHER): Payer: 59 | Admitting: Family Medicine

## 2017-02-28 ENCOUNTER — Encounter: Payer: Self-pay | Admitting: Family Medicine

## 2017-02-28 ENCOUNTER — Other Ambulatory Visit: Payer: Self-pay | Admitting: Obstetrics and Gynecology

## 2017-02-28 VITALS — BP 108/70 | HR 80 | Temp 98.3°F | Resp 24 | Wt 199.0 lb

## 2017-02-28 DIAGNOSIS — R928 Other abnormal and inconclusive findings on diagnostic imaging of breast: Secondary | ICD-10-CM

## 2017-02-28 DIAGNOSIS — J069 Acute upper respiratory infection, unspecified: Secondary | ICD-10-CM

## 2017-02-28 MED ORDER — BENZONATATE 200 MG PO CAPS: 200.0000 mg | ORAL_CAPSULE | Freq: Two times a day (BID) | ORAL | 0 refills | Status: DC | PRN

## 2017-02-28 NOTE — Progress Notes (Signed)
Pre visit review using our clinic review tool, if applicable. No additional management support is needed unless otherwise documented below in the visit note. 

## 2017-02-28 NOTE — Progress Notes (Signed)
SUBJECTIVE:  Barbara Riley is a 51 y.o. female who complains of coryza, congestion, sore throat and productive cough for 4 days. She denies a history of anorexia, chest pain and chills and denies a history of asthma. Patient admits to smoke cigarettes.   Current Outpatient Prescriptions on File Prior to Visit  Medication Sig Dispense Refill  . diphenhydrAMINE (BENADRYL) 25 mg capsule Take 1 capsule (25 mg total) by mouth every 6 (six) hours as needed. 60 capsule 0  . medroxyPROGESTERone (DEPO-PROVERA) 150 MG/ML injection Inject 150 mg into the muscle every 3 (three) months.      Marland Kitchen PARoxetine (PAXIL) 10 MG tablet Take 10 mg by mouth daily.    . polyethylene glycol powder (GLYCOLAX/MIRALAX) powder Take 17 g by mouth daily as needed for moderate constipation. 255 g 1  . Red Yeast Rice 600 MG CAPS Take 1 capsule by mouth daily.     No current facility-administered medications on file prior to visit.     Allergies  Allergen Reactions  . Penicillins     REACTION: rash    Past Medical History:  Diagnosis Date  . Anxiety    Stress with toddler and bills  . ANXIETY 04/15/2007   Qualifier: Diagnosis of  By: Silvio Pate MD, [facility]   . Carpal tunnel syndrome    mild right sided (R median sensory nerve only    Past Surgical History:  Procedure Laterality Date  . CESAREAN SECTION    . MIDDLE EAR SURGERY  1985   perf repair on right    Family History  Problem Relation Age of Onset  . Heart disease Father 47    MI  . Stroke Father 10  . Heart disease      GM  . Liver cancer Maternal Grandmother     GM  . Stroke Paternal Grandfather     Social History   Social History  . Marital status: Married    Spouse name: N/A  . Number of children: 1  . Years of education: N/A   Occupational History  . [employer]   Social History Main Topics  . Smoking status: Former Smoker    Packs/day: 0.25    Types: Cigarettes  . Smokeless tobacco: Never Used     Comment: 4-5  cigarettes daily  . Alcohol use No  . Drug use: No  . Sexual activity: Yes    Birth control/ protection: Injection   Other Topics Concern  . Not on file   Social History Narrative   Caffeine: 1-2 cups tea/day   Married, lives with husband and son    Occupation: [employer] employee   Activity: no regular exercise, some walking at flea market    Diet: some water, fruits/vegetables daily, brings lunch from home, no fish   The PMH, PSH, Social History, Family History, Medications, and allergies have been reviewed in [facility], and have been updated if relevant.  OBJECTIVE: BP 108/70 (BP Location: Left Arm, Patient Position: Sitting, Cuff Size: Large)   Pulse 80   Temp 98.3 F (36.8 C) (Oral)   Resp (!) 24   Wt 199 lb (90.3 kg)   SpO2 98%   BMI 29.39 kg/m   She appears well, vital signs are as noted. Ears normal.  Throat and pharynx normal.  Neck supple. No adenopathy in the neck. Nose is congested. Sinuses non tender. The chest is clear, without wheezes or rales.  ASSESSMENT:  viral upper respiratory illness  PLAN: Symptomatic therapy suggested:  push fluids, rest and return office visit prn if symptoms persist or worsen. Lack of antibiotic effectiveness discussed with her. Call or return to clinic prn if these symptoms worsen or fail to improve as anticipated.

## 2017-02-28 NOTE — Patient Instructions (Signed)
Great to see you. Drink plenty of fluids.   Tessalon as needed for cough.

## 2017-03-05 ENCOUNTER — Ambulatory Visit
Admission: RE | Admit: 2017-03-05 | Discharge: 2017-03-05 | Disposition: A | Discharge status: Home / Self Care | Payer: 59 | Source: Ambulatory Visit | Attending: Obstetrics and Gynecology | Admitting: Obstetrics and Gynecology

## 2017-03-05 DIAGNOSIS — R928 Other abnormal and inconclusive findings on diagnostic imaging of breast: Secondary | ICD-10-CM

## 2017-08-04 LAB — BASIC METABOLIC PANEL: CREATININE: 0.7 (ref 0.5–1.1)

## 2017-08-04 LAB — LIPID PANEL
Cholesterol: 185 (ref 0–200)
HDL: 34 — AB (ref 35–70)
LDL Cholesterol: 103
Triglycerides: 240 — AB (ref 40–160)

## 2017-08-04 LAB — HEMOGLOBIN A1C: Hemoglobin A1C: 6

## 2017-09-17 ENCOUNTER — Encounter: Payer: 59 | Admitting: Family Medicine

## 2017-09-19 ENCOUNTER — Encounter: Payer: Self-pay | Admitting: Family Medicine

## 2017-09-19 ENCOUNTER — Ambulatory Visit (INDEPENDENT_AMBULATORY_CARE_PROVIDER_SITE_OTHER): Payer: 59 | Admitting: Family Medicine

## 2017-09-19 VITALS — BP 120/76 | HR 98 | Temp 98.2°F | Ht 68.0 in | Wt 201.8 lb

## 2017-09-19 DIAGNOSIS — F17200 Nicotine dependence, unspecified, uncomplicated: Secondary | ICD-10-CM | POA: Diagnosis not present

## 2017-09-19 DIAGNOSIS — E669 Obesity, unspecified: Secondary | ICD-10-CM

## 2017-09-19 DIAGNOSIS — Z0000 Encounter for general adult medical examination without abnormal findings: Secondary | ICD-10-CM

## 2017-09-19 DIAGNOSIS — F4320 Adjustment disorder, unspecified: Secondary | ICD-10-CM

## 2017-09-19 DIAGNOSIS — R7303 Prediabetes: Secondary | ICD-10-CM | POA: Diagnosis not present

## 2017-09-19 DIAGNOSIS — E785 Hyperlipidemia, unspecified: Secondary | ICD-10-CM

## 2017-09-19 MED ORDER — BUPROPION HCL ER (SR) 100 MG PO TB12: 100.0000 mg | ORAL_TABLET | Freq: Two times a day (BID) | ORAL | 11 refills | Status: DC

## 2017-09-19 NOTE — Assessment & Plan Note (Signed)
Preventative protocols reviewed and updated unless pt declined. Discussed healthy diet and lifestyle.  

## 2017-09-19 NOTE — Assessment & Plan Note (Addendum)
Chronic, deteriorated namely triglycerides. Anticipate diet related. Continue RYR 1 cap daily.  The 10-year ASCVD risk score Mikey Bussing DC Brooke Bonito., et al., 2013) is: 1.7%*   Values used to calculate the score:     Age: 51 years     Sex: Female     Is Non-Hispanic African American: No     Diabetic: No     Tobacco smoker: No     Systolic Blood Pressure: 072 mmHg     Is BP treated: No     HDL Cholesterol: 36 mg/dL     Total Cholesterol: 200 mg/dL*     * - Cholesterol units were assumed for this score calculation

## 2017-09-19 NOTE — Patient Instructions (Addendum)
Trial wellbutrin in place of paxil. Decrease paxil to 1/2 tablet daily, start wellbutrin once daily for 1 week then increase to twice daily and come off paxil.  Let us know sooner if any trouble with taper. Work on quitting smoking. Return as needed or in 1 year for next physical.  Health Maintenance, Female Adopting a healthy lifestyle and getting preventive care can go a long way to promote health and wellness. Talk with your health care provider about what schedule of regular examinations is right for you. This is a good chance for you to check in with your provider about disease prevention and staying healthy. In between checkups, there are plenty of things you can do on your own. Experts have done a lot of research about which lifestyle changes and preventive measures are most likely to keep you healthy. Ask your health care provider for more information. Weight and diet Eat a healthy diet  Be sure to include plenty of vegetables, fruits, low-fat dairy products, and lean protein.  Do not eat a lot of foods high in solid fats, added sugars, or salt.  Get regular exercise. This is one of the most important things you can do for your health. ? Most adults should exercise for at least 150 minutes each week. The exercise should increase your heart rate and make you sweat (moderate-intensity exercise). ? Most adults should also do strengthening exercises at least twice a week. This is in addition to the moderate-intensity exercise.  Maintain a healthy weight  Body mass index (BMI) is a measurement that can be used to identify possible weight problems. It estimates body fat based on height and weight. Your health care provider can help determine your BMI and help you achieve or maintain a healthy weight.  For females 40 years of age and older: ? A BMI below 18.5 is considered underweight. ? A BMI of 18.5 to 24.9 is normal. ? A BMI of 25 to 29.9 is considered overweight. ? A BMI of 30 and  above is considered obese.  Watch levels of cholesterol and blood lipids  You should start having your blood tested for lipids and cholesterol at 50 years of age, then have this test every 5 years.  You may need to have your cholesterol levels checked more often if: ? Your lipid or cholesterol levels are high. ? You are older than 50 years of age. ? You are at high risk for heart disease.  Cancer screening Lung Cancer  Lung cancer screening is recommended for adults 18-34 years old who are at high risk for lung cancer because of a history of smoking.  A yearly low-dose CT scan of the lungs is recommended for people who: ? Currently smoke. ? Have quit within the past 15 years. ? Have at least a 30-pack-year history of smoking. A pack year is smoking an average of one pack of cigarettes a day for 1 year.  Yearly screening should continue until it has been 15 years since you quit.  Yearly screening should stop if you develop a health problem that would prevent you from having lung cancer treatment.  Breast Cancer  Practice breast self-awareness. This means understanding how your breasts normally appear and feel.  It also means doing regular breast self-exams. Let your health care provider know about any changes, no matter how small.  If you are in your 20s or 30s, you should have a clinical breast exam (CBE) by a health care provider every 1-3 years as  part of a regular health exam.  If you are 40 or older, have a CBE every year. Also consider having a breast X-ray (mammogram) every year.  If you have a family history of breast cancer, talk to your health care provider about genetic screening.  If you are at high risk for breast cancer, talk to your health care provider about having an MRI and a mammogram every year.  Breast cancer gene (BRCA) assessment is recommended for women who have family members with BRCA-related cancers. BRCA-related cancers  include: ? Breast. ? Ovarian. ? Tubal. ? Peritoneal cancers.  Results of the assessment will determine the need for genetic counseling and BRCA1 and BRCA2 testing.  Cervical Cancer Your health care provider may recommend that you be screened regularly for cancer of the pelvic organs (ovaries, uterus, and vagina). This screening involves a pelvic examination, including checking for microscopic changes to the surface of your cervix (Pap test). You may be encouraged to have this screening done every 3 years, beginning at age 66.  For women ages 23-65, health care providers may recommend pelvic exams and Pap testing every 3 years, or they may recommend the Pap and pelvic exam, combined with testing for human papilloma virus (HPV), every 5 years. Some types of HPV increase your risk of cervical cancer. Testing for HPV may also be done on women of any age with unclear Pap test results.  Other health care providers may not recommend any screening for nonpregnant women who are considered low risk for pelvic cancer and who do not have symptoms. Ask your health care provider if a screening pelvic exam is right for you.  If you have had past treatment for cervical cancer or a condition that could lead to cancer, you need Pap tests and screening for cancer for at least 20 years after your treatment. If Pap tests have been discontinued, your risk factors (such as having a new sexual partner) need to be reassessed to determine if screening should resume. Some women have medical problems that increase the chance of getting cervical cancer. In these cases, your health care provider may recommend more frequent screening and Pap tests.  Colorectal Cancer  This type of cancer can be detected and often prevented.  Routine colorectal cancer screening usually begins at 50 years of age and continues through 50 years of age.  Your health care provider may recommend screening at an earlier age if you have risk factors  for colon cancer.  Your health care provider may also recommend using home test kits to check for hidden blood in the stool.  A small camera at the end of a tube can be used to examine your colon directly (sigmoidoscopy or colonoscopy). This is done to check for the earliest forms of colorectal cancer.  Routine screening usually begins at age 84.  Direct examination of the colon should be repeated every 5-10 years through 50 years of age. However, you may need to be screened more often if early forms of precancerous polyps or small growths are found.  Skin Cancer  Check your skin from head to toe regularly.  Tell your health care provider about any new moles or changes in moles, especially if there is a change in a mole's shape or color.  Also tell your health care provider if you have a mole that is larger than the size of a pencil eraser.  Always use sunscreen. Apply sunscreen liberally and repeatedly throughout the day.  Protect yourself by wearing  long sleeves, pants, a wide-brimmed hat, and sunglasses whenever you are outside.  Heart disease, diabetes, and high blood pressure  High blood pressure causes heart disease and increases the risk of stroke. High blood pressure is more likely to develop in: ? People who have blood pressure in the high end of the normal range (130-139/85-89 mm Hg). ? People who are overweight or obese. ? People who are African American.  If you are 84-53 years of age, have your blood pressure checked every 3-5 years. If you are 36 years of age or older, have your blood pressure checked every year. You should have your blood pressure measured twice-once when you are at a hospital or clinic, and once when you are not at a hospital or clinic. Record the average of the two measurements. To check your blood pressure when you are not at a hospital or clinic, you can use: ? An automated blood pressure machine at a pharmacy. ? A home blood pressure monitor.  If  you are between 81 years and 46 years old, ask your health care provider if you should take aspirin to prevent strokes.  Have regular diabetes screenings. This involves taking a blood sample to check your fasting blood sugar level. ? If you are at a normal weight and have a low risk for diabetes, have this test once every three years after 50 years of age. ? If you are overweight and have a high risk for diabetes, consider being tested at a younger age or more often. Preventing infection Hepatitis B  If you have a higher risk for hepatitis B, you should be screened for this virus. You are considered at high risk for hepatitis B if: ? You were born in a country where hepatitis B is common. Ask your health care provider which countries are considered high risk. ? Your parents were born in a high-risk country, and you have not been immunized against hepatitis B (hepatitis B vaccine). ? You have HIV or AIDS. ? You use needles to inject street drugs. ? You live with someone who has hepatitis B. ? You have had sex with someone who has hepatitis B. ? You get hemodialysis treatment. ? You take certain medicines for conditions, including cancer, organ transplantation, and autoimmune conditions.  Hepatitis C  Blood testing is recommended for: ? Everyone born from [birth year] through 1964. ? Anyone with known risk factors for hepatitis C.  Sexually transmitted infections (STIs)  You should be screened for sexually transmitted infections (STIs) including gonorrhea and chlamydia if: ? You are sexually active and are younger than 50 years of age. ? You are older than 50 years of age and your health care provider tells you that you are at risk for this type of infection. ? Your sexual activity has changed since you were last screened and you are at an increased risk for chlamydia or gonorrhea. Ask your health care provider if you are at risk.  If you do not have HIV, but are at risk, it may be recommended  that you take a prescription medicine daily to prevent HIV infection. This is called pre-exposure prophylaxis (PrEP). You are considered at risk if: ? You are sexually active and do not regularly use condoms or know the HIV status of your partner(s). ? You take drugs by injection. ? You are sexually active with a partner who has HIV.  Talk with your health care provider about whether you are at high risk of being infected with HIV.  If you choose to begin PrEP, you should first be tested for HIV. You should then be tested every 3 months for as long as you are taking PrEP. Pregnancy  If you are premenopausal and you may become pregnant, ask your health care provider about preconception counseling.  If you may become pregnant, take 400 to 800 micrograms (mcg) of folic acid every day.  If you want to prevent pregnancy, talk to your health care provider about birth control (contraception). Osteoporosis and menopause  Osteoporosis is a disease in which the bones lose minerals and strength with aging. This can result in serious bone fractures. Your risk for osteoporosis can be identified using a bone density scan.  If you are 8 years of age or older, or if you are at risk for osteoporosis and fractures, ask your health care provider if you should be screened.  Ask your health care provider whether you should take a calcium or vitamin D supplement to lower your risk for osteoporosis.  Menopause may have certain physical symptoms and risks.  Hormone replacement therapy may reduce some of these symptoms and risks. Talk to your health care provider about whether hormone replacement therapy is right for you. Follow these instructions at home:  Schedule regular health, dental, and eye exams.  Stay current with your immunizations.  Do not use any tobacco products including cigarettes, chewing tobacco, or electronic cigarettes.  If you are pregnant, do not drink alcohol.  If you are  breastfeeding, limit how much and how often you drink alcohol.  Limit alcohol intake to no more than 1 drink per day for nonpregnant women. One drink equals 12 ounces of beer, 5 ounces of wine, or 1 ounces of hard liquor.  Do not use street drugs.  Do not share needles.  Ask your health care provider for help if you need support or information about quitting drugs.  Tell your health care provider if you often feel depressed.  Tell your health care provider if you have ever been abused or do not feel safe at home. This information is not intended to replace advice given to you by your health care provider. Make sure you discuss any questions you have with your health care provider. Document Released: 06/19/2011 Document Revised: 05/11/2016 Document Reviewed: 09/07/2015 Elsevier Interactive Patient Education  [employer].

## 2017-09-19 NOTE — Assessment & Plan Note (Signed)
Long term on paxil. Discussed this as it relates to possible increased appetite, trouble losing weight, and possible sexual dysfunction. Pt agrees to trial wellbutrin - will cross taper over 1 wk from paxil to wellbutrin SL 100mg  goal BID. Pt agrees with plan.

## 2017-09-19 NOTE — Assessment & Plan Note (Signed)
Continues smoking. Continue to encourage cessation. Start wellbutrin SL 100mg  BID.

## 2017-09-19 NOTE — Progress Notes (Signed)
BP 120/76 (BP Location: Left Arm, Patient Position: Sitting, Cuff Size: Normal)   Pulse 98   Temp 98.2 F (36.8 C) (Oral)   Ht 5\' 8"  (1.727 m)   Wt 201 lb 12 oz (91.5 kg)   SpO2 98%   BMI 30.68 kg/m    CC: CPE Subjective:    Patient ID: Barbara Riley, female    DOB: Jun 16, 1966, 51 y.o.   MRN: [id number]  HPI: Barbara Riley is a 51 y.o. female presenting on 09/19/2017 for Annual Exam (provided copy of recent labs. Has form to be completed)   Brings eHealthScreenings form showing A1c 6.0%, Cr 0.67, TC 185, Trig 240, HDL 34. LDL 103. All numbers improved except triglycerides worse (despite working on diabetic diet for husband). Requests medical exemption form filled out for BMI. Would like to start walking routine.   Long term on paxil for years. Started with birth of her child. She finds this helps her concentrate at work ([employer]).   Preventative: Well woman yearly with Dr. Matthew Saras OBGYN normal pap.  Mammogram with GYN Flu shot - declines Tdap 2013 Seat belt use discussed Doesn't wear sunscreen. No changing moles. Smoking - 1/2 ppd  Alcohol - none  Caffeine: 1-2 cups tea/day Married, lives with husband and son  Occupation: [employer] employee Activity: no regular exercise Diet: some water, fruits/vegetables daily, brings lunch from home, no fish  Relevant past medical, surgical, family and social history reviewed and updated as indicated. Interim medical history since our last visit reviewed. Allergies and medications reviewed and updated. Outpatient Medications Prior to Visit  Medication Sig Dispense Refill  . diphenhydrAMINE (BENADRYL) 25 mg capsule Take 1 capsule (25 mg total) by mouth every 6 (six) hours as needed. 60 capsule 0  . medroxyPROGESTERone (DEPO-PROVERA) 150 MG/ML injection Inject 150 mg into the muscle every 3 (three) months.      . polyethylene glycol powder (GLYCOLAX/MIRALAX) powder Take 17 g by mouth daily as needed for moderate constipation. 255 g 1    . Red Yeast Rice 600 MG CAPS Take 1 capsule by mouth daily.    . benzonatate (TESSALON) 200 MG capsule Take 1 capsule (200 mg total) by mouth 2 (two) times daily as needed for cough. 20 capsule 0  . PARoxetine (PAXIL) 10 MG tablet Take 10 mg by mouth daily.     No facility-administered medications prior to visit.      Per HPI unless specifically indicated in ROS section below Review of Systems  Constitutional: Negative for activity change, appetite change, chills, fatigue, fever and unexpected weight change.  HENT: Negative for hearing loss.   Eyes: Negative for visual disturbance.  Respiratory: Positive for shortness of breath. Negative for cough, chest tightness and wheezing.   Cardiovascular: Negative for chest pain, palpitations and leg swelling.  Gastrointestinal: Negative for abdominal distention, abdominal pain, blood in stool, constipation, diarrhea, nausea and vomiting.  Genitourinary: Negative for difficulty urinating and hematuria.  Musculoskeletal: Negative for arthralgias, myalgias and neck pain.  Skin: Negative for rash.  Neurological: Negative for dizziness, seizures, syncope and headaches.  Hematological: Negative for adenopathy. Does not bruise/bleed easily.  Psychiatric/Behavioral: Negative for dysphoric mood. The patient is not nervous/anxious.        Objective:    BP 120/76 (BP Location: Left Arm, Patient Position: Sitting, Cuff Size: Normal)   Pulse 98   Temp 98.2 F (36.8 C) (Oral)   Ht 5\' 8"  (1.727 m)   Wt 201 lb 12 oz (91.5 kg)  SpO2 98%   BMI 30.68 kg/m   Wt Readings from Last 3 Encounters:  09/19/17 201 lb 12 oz (91.5 kg)  02/28/17 199 lb (90.3 kg)  09/14/16 203 lb 8 oz (92.3 kg)    Physical Exam  Constitutional: She is oriented to person, place, and time. She appears well-developed and well-nourished. No distress.  HENT:  Head: Normocephalic and atraumatic.  Right Ear: Hearing, tympanic membrane, external ear and ear canal normal.  Left Ear:  Hearing, tympanic membrane, external ear and ear canal normal.  Nose: Nose normal.  Mouth/Throat: Uvula is midline, oropharynx is clear and moist and mucous membranes are normal. No oropharyngeal exudate, posterior oropharyngeal edema or posterior oropharyngeal erythema.  Eyes: Pupils are equal, round, and reactive to light. Conjunctivae and EOM are normal. No scleral icterus.  Neck: Normal range of motion. Neck supple.  Cardiovascular: Normal rate, regular rhythm, normal heart sounds and intact distal pulses.   No murmur heard. Pulses:      Radial pulses are 2+ on the right side, and 2+ on the left side.  Pulmonary/Chest: Effort normal and breath sounds normal. No respiratory distress. She has no wheezes. She has no rales.  Abdominal: Soft. Bowel sounds are normal. She exhibits no distension and no mass. There is no tenderness. There is no rebound and no guarding.  Musculoskeletal: Normal range of motion. She exhibits no edema.  Lymphadenopathy:    She has no cervical adenopathy.  Neurological: She is alert and oriented to person, place, and time.  CN grossly intact, station and gait intact  Skin: Skin is warm and dry. No rash noted.  Psychiatric: She has a normal mood and affect. Her behavior is normal. Judgment and thought content normal.  Nursing note and vitals reviewed.  Results for orders placed or performed in visit on 09/15/16  Hemoglobin A1c  Result Value Ref Range   A1c 6.1   Creatinine  Result Value Ref Range   Creat 0.75   Lipid panel  Result Value Ref Range   Cholesterol 200    Triglycerides 154    HDL 36 35 - 70 mg/dL   LDL (calc) 133       Assessment & Plan:   Problem List Items Addressed This Visit    Adjustment disorder    Long term on paxil. Discussed this as it relates to possible increased appetite, trouble losing weight, and possible sexual dysfunction. Pt agrees to trial wellbutrin - will cross taper over 1 wk from paxil to wellbutrin SL 100mg  goal BID.  Pt agrees with plan.       Dyslipidemia    Chronic, deteriorated namely triglycerides. Anticipate diet related. Continue RYR 1 cap daily.  The 10-year ASCVD risk score Mikey Bussing DC Brooke Bonito., et al., 2013) is: 1.7%*   Values used to calculate the score:     Age: 37 years     Sex: Female     Is Non-Hispanic African American: No     Diabetic: No     Tobacco smoker: No     Systolic Blood Pressure: 616 mmHg     Is BP treated: No     HDL Cholesterol: 36 mg/dL     Total Cholesterol: 200 mg/dL*     * - Cholesterol units were assumed for this score calculation       Healthcare maintenance - Primary    Preventative protocols reviewed and updated unless pt declined. Discussed healthy diet and lifestyle.       Obesity, Class I,  BMI 30-34.9    Discussed healthy diet and lifestyle changes to affect sustainable weight loss.  Requests BMI exception form filled out today. Regimen over next year she will work on to lower BMI <30 goal will be: 1. Establish walking routine 2. Continue diabetic diet, decreasing mt dew sodas 3. Change paxil to wellbutrin for mood (see below)      Prediabetes    Reviewed stable readings. Discussed diet recommendations.       Smoker    Continues smoking. Continue to encourage cessation. Start wellbutrin SL 100mg  BID.           Follow up plan: Return in about 1 year (around 09/19/2018) for annual exam, prior fasting for blood work.  [name], MD

## 2017-09-19 NOTE — Assessment & Plan Note (Signed)
Reviewed stable readings. Discussed diet recommendations.

## 2017-09-19 NOTE — Assessment & Plan Note (Signed)
Discussed healthy diet and lifestyle changes to affect sustainable weight loss.  Requests BMI exception form filled out today. Regimen over next year she will work on to lower BMI <30 goal will be: 1. Establish walking routine 2. Continue diabetic diet, decreasing mt dew sodas 3. Change paxil to wellbutrin for mood (see below)

## 2017-11-22 ENCOUNTER — Encounter (HOSPITAL_COMMUNITY): Payer: Self-pay

## 2017-12-19 ENCOUNTER — Telehealth: Payer: Self-pay | Admitting: Family Medicine

## 2017-12-19 MED ORDER — PAROXETINE HCL 10 MG PO TABS: 10.0000 mg | ORAL_TABLET | Freq: Every day | ORAL | 11 refills | Status: DC

## 2017-12-19 NOTE — Telephone Encounter (Signed)
Pt. Is requesting to go back on Paxil. See request. Thanks.

## 2017-12-19 NOTE — Telephone Encounter (Signed)
Called and left voicemail for pt to return call to office. Please inform patient of recommendations upon return call.

## 2017-12-19 NOTE — Telephone Encounter (Signed)
Pt had annual exam on 09/19/17. No future appt scheduled.unable to reach pt by phone for additional info.

## 2017-12-19 NOTE — Telephone Encounter (Signed)
Ok to change back to paxil. Stop wellbutrin, start paxil10mg  daily sent to pharmacy.

## 2017-12-19 NOTE — Telephone Encounter (Signed)
Copied from [place] [id number]. Topic: Quick Communication - See Telephone Encounter >> Dec 19, 2017  2:12 PM Aurelio Brash B wrote: CRM for notification. See Telephone encounter for:  Refill  paxil,  wellbutrin is not working for her.  [facility] pharm 12/19/17.

## 2017-12-20 NOTE — Telephone Encounter (Signed)
Left message on vm for pt to call back.  Need to relay message per Dr. Danise Mina.

## 2018-06-06 ENCOUNTER — Encounter: Payer: Self-pay | Admitting: Family Medicine

## 2018-08-04 LAB — BASIC METABOLIC PANEL: CREATININE: 0.8 (ref 0.5–1.1)

## 2018-08-04 LAB — LIPID PANEL
CHOLESTEROL: 181 (ref 0–200)
HDL: 35 (ref 35–70)
LDL Cholesterol: 119
Triglycerides: 137 (ref 40–160)

## 2018-08-04 LAB — HEMOGLOBIN A1C: Hemoglobin A1C: 6

## 2018-09-20 ENCOUNTER — Ambulatory Visit: Payer: Managed Care, Other (non HMO) | Admitting: Family Medicine

## 2018-09-20 ENCOUNTER — Encounter: Payer: Self-pay | Admitting: Family Medicine

## 2018-09-20 VITALS — BP 114/62 | HR 90 | Temp 98.4°F | Ht 68.0 in | Wt 204.5 lb

## 2018-09-20 DIAGNOSIS — R7303 Prediabetes: Secondary | ICD-10-CM

## 2018-09-20 DIAGNOSIS — E785 Hyperlipidemia, unspecified: Secondary | ICD-10-CM | POA: Diagnosis not present

## 2018-09-20 DIAGNOSIS — M65341 Trigger finger, right ring finger: Secondary | ICD-10-CM

## 2018-09-20 DIAGNOSIS — E669 Obesity, unspecified: Secondary | ICD-10-CM | POA: Diagnosis not present

## 2018-09-20 DIAGNOSIS — F17200 Nicotine dependence, unspecified, uncomplicated: Secondary | ICD-10-CM

## 2018-09-20 DIAGNOSIS — M6530 Trigger finger, unspecified finger: Secondary | ICD-10-CM | POA: Insufficient documentation

## 2018-09-20 MED ORDER — PRAVASTATIN SODIUM 10 MG PO TABS: 10.0000 mg | ORAL_TABLET | Freq: Every day | ORAL | 11 refills | Status: DC

## 2018-09-20 NOTE — Progress Notes (Signed)
BP 114/62 (BP Location: Left Arm, Patient Position: Sitting, Cuff Size: Large)   Pulse 90   Temp 98.4 F (36.9 C) (Oral)   Ht 5\' 8"  (1.727 m)   Wt 204 lb 8 oz (92.8 kg)   SpO2 96%   BMI 31.09 kg/m    CC: discuss form Subjective:    Patient ID: Barbara Riley, female    DOB: [birth year], 52 y.o.   MRN: [id number]  HPI: Barbara Riley is a 52 y.o. female presenting on 09/20/2018 for Form Completion (Needs to have BMI appeal done. Pt provided copy of labs (made a copy0/) and Discuss Medication (Wants to discuss red yeast rice.)   Here to discuss BMI appeal.   This week she gave up mountain dews! She has increased water, still drinking pico sweet tea. Started taking nuts and fruits in place of chips as a snack at work. Planning on increasing walking routine - 30 min in evenings, walking with 12yo son.   HLD - RYR not helpful. Pt interested in discussing alternatives. Strong fmhx HLD.  Depo recently stopped - ?perimenopausal - planning labwork with OBGYN. Thyroid checked normal.   Last year we changed paxil to wellbutrin - did not do well with this - switched to lexapro.   Smoking 1/2 ppd - contemplative.  Notes R 4th finger triggering.   Relevant past medical, surgical, family and social history reviewed and updated as indicated. Interim medical history since our last visit reviewed. Allergies and medications reviewed and updated. Outpatient Medications Prior to Visit  Medication Sig Dispense Refill  . diphenhydrAMINE (BENADRYL) 25 mg capsule Take 1 capsule (25 mg total) by mouth every 6 (six) hours as needed. 60 capsule 0  . escitalopram (LEXAPRO) 10 MG tablet Take 1 tablet by mouth daily.  0  . Red Yeast Rice 600 MG CAPS Take 1 capsule by mouth daily.    . medroxyPROGESTERone (DEPO-PROVERA) 150 MG/ML injection Inject 150 mg into the muscle every 3 (three) months.      Marland Kitchen PARoxetine (PAXIL) 10 MG tablet Take 1 tablet (10 mg total) by mouth daily. 30 tablet 11  . polyethylene  glycol powder (GLYCOLAX/MIRALAX) powder Take 17 g by mouth daily as needed for moderate constipation. 255 g 1   No facility-administered medications prior to visit.      Per HPI unless specifically indicated in ROS section below Review of Systems     Objective:    BP 114/62 (BP Location: Left Arm, Patient Position: Sitting, Cuff Size: Large)   Pulse 90   Temp 98.4 F (36.9 C) (Oral)   Ht 5\' 8"  (1.727 m)   Wt 204 lb 8 oz (92.8 kg)   SpO2 96%   BMI 31.09 kg/m   Wt Readings from Last 3 Encounters:  09/20/18 204 lb 8 oz (92.8 kg)  09/19/17 201 lb 12 oz (91.5 kg)  02/28/17 199 lb (90.3 kg)    Physical Exam  Constitutional: She appears well-developed and well-nourished. No distress.  HENT:  Mouth/Throat: Oropharynx is clear and moist. No oropharyngeal exudate.  Cardiovascular: Normal rate, regular rhythm and normal heart sounds.  No murmur heard. Pulmonary/Chest: Effort normal and breath sounds normal. No respiratory distress. She has no wheezes. She has no rales.  Musculoskeletal: She exhibits no edema.  Triggering of R 4th digit, finger sticks  Tender nodule palmar R 4th metacarpal  Psychiatric: She has a normal mood and affect.  Nursing note and vitals reviewed.  Results for orders placed or performed  in visit on 10/13/24  Basic metabolic panel  Result Value Ref Range   Creatinine 0.7 0.5 - 1.1  Lipid panel  Result Value Ref Range   Triglycerides 240 (A) 40 - 160   Cholesterol 185 0 - 200   HDL 34 (A) 35 - 70   LDL Cholesterol 103   Hemoglobin A1c  Result Value Ref Range   Hemoglobin A1C 6.0       Assessment & Plan:   Problem List Items Addressed This Visit    Trigger finger of right hand    Reviewed cause with patient - she will check with her orthopedist and let me know if needs referral from me.       Smoker    Continue to encourage cessation - contemplative      Prediabetes    Aware to avoid added sugars/ sweetened beverages      Obesity, Class I,  BMI 30-34.9 - Primary    Discussed healthy diet and lifestyle changes to affect sustainable weight loss.  Regimen over the next year will be: 1. Start walking 30 min daily 2. Stop all Mt Dew 3. Bring healthy snacks to work (nuts, fruits in place of chips)      Dyslipidemia    Chronic, mild. Strong fmhx CAD (father). Pt interested in low potency statin - pravastatin 10mg  prescribed.  The 10-year ASCVD risk score Mikey Bussing DC Brooke Bonito., et al., 2013) is: 1.6%*   Values used to calculate the score:     Age: 43 years     Sex: Female     Is Non-Hispanic African American: No     Diabetic: No     Tobacco smoker: No     Systolic Blood Pressure: 366 mmHg     Is BP treated: No     HDL Cholesterol: 34 mg/dL*     Total Cholesterol: 185 mg/dL*     * - Cholesterol units were assumed for this score calculation       Relevant Medications   pravastatin (PRAVACHOL) 10 MG tablet       Meds ordered this encounter  Medications  . pravastatin (PRAVACHOL) 10 MG tablet    Sig: Take 1 tablet (10 mg total) by mouth daily.    Dispense:  30 tablet    Refill:  11   No orders of the defined types were placed in this encounter.   Follow up plan: No follow-ups on file.  [name], MD

## 2018-09-20 NOTE — Assessment & Plan Note (Signed)
Continue to encourage cessation. contemplative 

## 2018-09-20 NOTE — Assessment & Plan Note (Signed)
Chronic, mild. Strong fmhx CAD (father). Pt interested in low potency statin - pravastatin 10mg  prescribed.  The 10-year ASCVD risk score Mikey Bussing DC Brooke Bonito., et al., 2013) is: 1.6%*   Values used to calculate the score:     Age: 52 years     Sex: Female     Is Non-Hispanic African American: No     Diabetic: No     Tobacco smoker: No     Systolic Blood Pressure: 947 mmHg     Is BP treated: No     HDL Cholesterol: 34 mg/dL*     Total Cholesterol: 185 mg/dL*     * - Cholesterol units were assumed for this score calculation

## 2018-09-20 NOTE — Assessment & Plan Note (Signed)
Reviewed cause with patient - she will check with her orthopedist and let me know if needs referral from me.

## 2018-09-20 NOTE — Patient Instructions (Addendum)
Ok to try pravastatin 10mg  daily sent to pharmacy  Form filled out today. Work towards goal of BMI <30.  You have triggering of the right 4th finger - check with hand doctor about this. Let us know if you need referral.

## 2018-09-20 NOTE — Assessment & Plan Note (Addendum)
Discussed healthy diet and lifestyle changes to affect sustainable weight loss.  Regimen over the next year will be: 1. Start walking 30 min daily 2. Stop all Mt Dew 3. Bring healthy snacks to work (nuts, fruits in place of chips)

## 2018-09-20 NOTE — Assessment & Plan Note (Signed)
Aware to avoid added sugars/ sweetened beverages

## 2018-11-19 ENCOUNTER — Other Ambulatory Visit (HOSPITAL_COMMUNITY): Payer: Self-pay | Admitting: Orthopedic Surgery

## 2018-11-19 ENCOUNTER — Ambulatory Visit (HOSPITAL_COMMUNITY)
Admission: RE | Admit: 2018-11-19 | Discharge: 2018-11-19 | Disposition: A | Discharge status: Home / Self Care | Payer: Managed Care, Other (non HMO) | Source: Ambulatory Visit | Attending: Cardiovascular Disease | Admitting: Cardiovascular Disease

## 2018-11-19 DIAGNOSIS — M7989 Other specified soft tissue disorders: Secondary | ICD-10-CM

## 2018-11-19 DIAGNOSIS — M79605 Pain in left leg: Secondary | ICD-10-CM | POA: Diagnosis present

## 2019-01-07 DIAGNOSIS — E785 Hyperlipidemia, unspecified: Secondary | ICD-10-CM | POA: Insufficient documentation

## 2019-06-04 LAB — HM MAMMOGRAPHY

## 2019-07-29 LAB — HM PAP SMEAR: HM Pap smear: NORMAL

## 2019-08-19 HISTORY — PX: HYSTEROSCOPY WITH NOVASURE: SHX5574

## 2019-08-30 ENCOUNTER — Other Ambulatory Visit: Payer: Self-pay | Admitting: Family Medicine

## 2019-09-01 NOTE — Telephone Encounter (Signed)
E-scribed refill.  Pls schedule CPE and labs. 

## 2019-09-02 ENCOUNTER — Encounter: Payer: Self-pay | Admitting: Family Medicine

## 2019-09-02 NOTE — Telephone Encounter (Signed)
Pt scheduled for 10/28/19 same day labs and also wanted her husband to come in right after her or at the same time. I made her appt at 3:30 and his at 4pm.

## 2019-10-28 ENCOUNTER — Other Ambulatory Visit: Payer: Self-pay

## 2019-10-28 ENCOUNTER — Ambulatory Visit (INDEPENDENT_AMBULATORY_CARE_PROVIDER_SITE_OTHER): Payer: Managed Care, Other (non HMO) | Admitting: Family Medicine

## 2019-10-28 ENCOUNTER — Encounter: Payer: Self-pay | Admitting: Family Medicine

## 2019-10-28 VITALS — BP 122/78 | HR 85 | Temp 97.8°F | Ht 68.0 in | Wt 205.3 lb

## 2019-10-28 DIAGNOSIS — Z0000 Encounter for general adult medical examination without abnormal findings: Secondary | ICD-10-CM | POA: Diagnosis not present

## 2019-10-28 DIAGNOSIS — R7303 Prediabetes: Secondary | ICD-10-CM

## 2019-10-28 DIAGNOSIS — E669 Obesity, unspecified: Secondary | ICD-10-CM

## 2019-10-28 DIAGNOSIS — E785 Hyperlipidemia, unspecified: Secondary | ICD-10-CM | POA: Diagnosis not present

## 2019-10-28 DIAGNOSIS — F4320 Adjustment disorder, unspecified: Secondary | ICD-10-CM

## 2019-10-28 DIAGNOSIS — F17200 Nicotine dependence, unspecified, uncomplicated: Secondary | ICD-10-CM

## 2019-10-28 LAB — POCT GLYCOSYLATED HEMOGLOBIN (HGB A1C): Hemoglobin A1C: 6.2 % — AB (ref 4.0–5.6)

## 2019-10-28 MED ORDER — PRAVASTATIN SODIUM 10 MG PO TABS: 10.0000 mg | ORAL_TABLET | Freq: Every day | ORAL | 3 refills | Status: DC

## 2019-10-28 MED ORDER — DIPHENHYDRAMINE HCL 25 MG PO CAPS: 25.0000 mg | ORAL_CAPSULE | Freq: Four times a day (QID) | ORAL | Status: AC | PRN

## 2019-10-28 NOTE — Assessment & Plan Note (Signed)
rec avoid added sugars and sweetened beverages in diet.

## 2019-10-28 NOTE — Assessment & Plan Note (Signed)
Chronic, update FLP on pravastatin. Tolerating well. The 10-year ASCVD risk score Mikey Bussing DC Brooke Bonito., et al., 2013) is: 5.3%*   Values used to calculate the score:     Age: 53 years     Sex: Female     Is Non-Hispanic African American: No     Diabetic: No     Tobacco smoker: Yes     Systolic Blood Pressure: 123XX123 mmHg     Is BP treated: No     HDL Cholesterol: 35 mg/dL*     Total Cholesterol: 181 mg/dL*     * - Cholesterol units were assumed for this score calculation

## 2019-10-28 NOTE — Progress Notes (Signed)
This visit was conducted in person.  BP 122/78 (BP Location: Left Arm, Patient Position: Sitting, Cuff Size: Large)   Pulse 85   Temp 97.8 F (36.6 C) (Temporal)   Ht 5\' 8"  (1.727 m)   Wt 205 lb 5 oz (93.1 kg)   SpO2 98%   BMI 31.22 kg/m    CC: CPE Subjective:    Patient ID: Barbara Riley, female    DOB: [birth year], 53 y.o.   MRN: [id number]  HPI: Barbara Riley is a 53 y.o. 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  Well woman yearly with Dr. Matthew Saras OBGYN 08/2019 - had ablation for heavy bleeding after stopped birth control - continues depo provera Q28mo (has been on this for 12 yrs).  Mammogram with GYN 12/2018 - will request updated records.  Flu shot - declines Tdap 2013 Seat belt use discussed Doesn't wear sunscreen. No changing moles. Smoking - 1/2 ppd, started age 49 yo.  Alcohol - none Dentist q6 mo  Eye exam yearly   Caffeine: 1-2 cups tea/day Married, lives with husband and son  Occupation: [employer] employee Activity: no regular exercise  Diet: some water, fruits/vegetables daily, brings lunch from home, no fish     Relevant past medical, surgical, family and social history reviewed and updated as indicated. Interim medical history since our last visit reviewed. Allergies and medications reviewed and updated. Outpatient Medications Prior to Visit  Medication Sig Dispense Refill  . escitalopram (LEXAPRO) 10 MG tablet Take 1 tablet by mouth daily.  0  . medroxyPROGESTERone (DEPO-PROVERA) 150 MG/ML injection Inject 150 mg into the muscle every 3 (three) months.    . diphenhydrAMINE (BENADRYL) 25 mg capsule Take 1 capsule (25 mg total) by mouth every 6 (six) hours as needed. 60 capsule 0  . pravastatin (PRAVACHOL) 10 MG tablet TAKE 1 TABLET(10 MG) BY MOUTH DAILY 30 tablet 2   No facility-administered  medications prior to visit.      Per HPI unless specifically indicated in ROS section below Review of Systems  Constitutional: Negative for activity change, appetite change, chills, fatigue, fever and unexpected weight change.  HENT: Negative for hearing loss.   Eyes: Negative for visual disturbance.  Respiratory: Negative for cough, chest tightness, shortness of breath and wheezing.   Cardiovascular: Negative for chest pain, palpitations and leg swelling.  Gastrointestinal: Negative for abdominal distention, abdominal pain, blood in stool, constipation, diarrhea, nausea and vomiting.  Genitourinary: Negative for difficulty urinating and hematuria.  Musculoskeletal: Negative for arthralgias, myalgias and neck pain.  Skin: Negative for rash.  Neurological: Negative for dizziness, seizures, syncope and headaches.  Hematological: Negative for adenopathy. Does not bruise/bleed easily.  Psychiatric/Behavioral: Negative for dysphoric mood. The patient is not nervous/anxious.    Objective:    BP 122/78 (BP Location: Left Arm, Patient Position: Sitting, Cuff Size: Large)   Pulse 85   Temp 97.8 F (36.6 C) (Temporal)   Ht 5\' 8"  (1.727 m)   Wt 205 lb 5 oz (93.1 kg)   SpO2 98%   BMI 31.22 kg/m   Wt Readings from Last 3 Encounters:  10/28/19 205 lb 5 oz (93.1 kg)  09/20/18 204 lb 8 oz (92.8 kg)  09/19/17 201 lb 12 oz (91.5 kg)    Physical Exam Vitals signs and nursing note reviewed.  Constitutional:      General:  She is not in acute distress.    Appearance: Normal appearance. She is well-developed. She is not ill-appearing.  HENT:     Head: Normocephalic and atraumatic.     Right Ear: Hearing, tympanic membrane, ear canal and external ear normal.     Left Ear: Hearing, tympanic membrane, ear canal and external ear normal.     Nose: Nose normal.     Mouth/Throat:     Mouth: Mucous membranes are moist.     Pharynx: Oropharynx is clear. Uvula midline. No posterior oropharyngeal  erythema.  Eyes:     General: No scleral icterus.    Conjunctiva/sclera: Conjunctivae normal.     Pupils: Pupils are equal, round, and reactive to light.  Neck:     Musculoskeletal: Normal range of motion and neck supple.  Cardiovascular:     Rate and Rhythm: Normal rate and regular rhythm.     Pulses: Normal pulses.          Radial pulses are 2+ on the right side and 2+ on the left side.     Heart sounds: Normal heart sounds. No murmur.  Pulmonary:     Effort: Pulmonary effort is normal. No respiratory distress.     Breath sounds: Normal breath sounds. No wheezing, rhonchi or rales.  Abdominal:     General: Abdomen is flat. Bowel sounds are normal. There is no distension.     Palpations: Abdomen is soft. There is no mass.     Tenderness: There is no abdominal tenderness. There is no guarding or rebound.     Hernia: No hernia is present.  Musculoskeletal: Normal range of motion.     Right lower leg: No edema.     Left lower leg: No edema.  Lymphadenopathy:     Cervical: No cervical adenopathy.  Skin:    General: Skin is warm and dry.     Findings: No rash.  Neurological:     General: No focal deficit present.     Mental Status: She is alert and oriented to person, place, and time.     Comments: CN grossly intact, station and gait intact  Psychiatric:        Mood and Affect: Mood normal.        Behavior: Behavior normal.        Thought Content: Thought content normal.        Judgment: Judgment normal.       Results for orders placed or performed in visit on 10/28/19  POCT glycosylated hemoglobin (Hb A1C)  Result Value Ref Range   Hemoglobin A1C 6.2 (A) 4.0 - 5.6 %   HbA1c POC (<> result, manual entry)     HbA1c, POC (prediabetic range)     HbA1c, POC (controlled diabetic range)     Assessment & Plan:  No work exemption form needed at this time. Advised I will not continue filling out if no improvement over the years.  Problem List Items Addressed This Visit    Smoker     Encourage full smoking cessation. Precontemplative at this time.       Prediabetes    rec avoid added sugars and sweetened beverages in diet.       Relevant Orders   POCT glycosylated hemoglobin (Hb A1C) (Completed)   Obesity, Class I, BMI 30-34.9    Encouraged healthy diet and lifestyle changes to affect sustainable weight loss. Difficult year with pandemic.       Healthcare maintenance - Primary  Preventative protocols reviewed and updated unless pt declined. Discussed healthy diet and lifestyle.       Dyslipidemia    Chronic, update FLP on pravastatin. Tolerating well. The 10-year ASCVD risk score Mikey Bussing DC Brooke Bonito., et al., 2013) is: 5.3%*   Values used to calculate the score:     Age: 62 years     Sex: Female     Is Non-Hispanic African American: No     Diabetic: No     Tobacco smoker: Yes     Systolic Blood Pressure: 123XX123 mmHg     Is BP treated: No     HDL Cholesterol: 35 mg/dL*     Total Cholesterol: 181 mg/dL*     * - Cholesterol units were assumed for this score calculation       Relevant Medications   pravastatin (PRAVACHOL) 10 MG tablet   Other Relevant Orders   Lipid panel   Comprehensive metabolic panel   TSH   Adjustment disorder    Stable period on lexapro 10mg  through OBGYN.           Meds ordered this encounter  Medications  . pravastatin (PRAVACHOL) 10 MG tablet    Sig: Take 1 tablet (10 mg total) by mouth daily.    Dispense:  90 tablet    Refill:  3  . diphenhydrAMINE (BENADRYL) 25 mg capsule    Sig: Take 1 capsule (25 mg total) by mouth every 6 (six) hours as needed.   Orders Placed This Encounter  Procedures  . Lipid panel  . Comprehensive metabolic panel  . TSH  . POCT glycosylated hemoglobin (Hb A1C)    Patient instructions: We will request recent mammogram/pap smear/cologuard results from Dr Matthew Saras.  Labs today.  A1c remains in prediabetes range - work on limiting added sugars in the diet.  Work on smoking cessation. Return  as needed or in 1 year for next physical.  Follow up plan: Return in about 1 year (around 10/27/2020) for annual exam, prior fasting for blood work.  [name], MD

## 2019-10-28 NOTE — Assessment & Plan Note (Addendum)
Stable period on lexapro 10mg  through OBGYN.

## 2019-10-28 NOTE — Assessment & Plan Note (Signed)
Encouraged healthy diet and lifestyle changes to affect sustainable weight loss. Difficult year with pandemic.

## 2019-10-28 NOTE — Patient Instructions (Addendum)
We will request recent mammogram/pap smear/cologuard results from Dr Matthew Saras.  Labs today.  A1c remains in prediabetes range - work on limiting added sugars in the diet.  Work on smoking cessation. Return as needed or in 1 year for next physical.  Health Maintenance, Female Adopting a healthy lifestyle and getting preventive care are important in promoting health and wellness. Ask your health care provider about:  The right schedule for you to have regular tests and exams.  Things you can do on your own to prevent diseases and keep yourself healthy. What should I know about diet, weight, and exercise? Eat a healthy diet   Eat a diet that includes plenty of vegetables, fruits, low-fat dairy products, and lean protein.  Do not eat a lot of foods that are high in solid fats, added sugars, or sodium. Maintain a healthy weight Body mass index (BMI) is used to identify weight problems. It estimates body fat based on height and weight. Your health care provider can help determine your BMI and help you achieve or maintain a healthy weight. Get regular exercise Get regular exercise. This is one of the most important things you can do for your health. Most adults should:  Exercise for at least 150 minutes each week. The exercise should increase your heart rate and make you sweat (moderate-intensity exercise).  Do strengthening exercises at least twice a week. This is in addition to the moderate-intensity exercise.  Spend less time sitting. Even light physical activity can be beneficial. Watch cholesterol and blood lipids Have your blood tested for lipids and cholesterol at 52 years of age, then have this test every 5 years. Have your cholesterol levels checked more often if:  Your lipid or cholesterol levels are high.  You are older than 52 years of age.  You are at high risk for heart disease. What should I know about cancer screening? Depending on your health history and family  history, you may need to have cancer screening at various ages. This may include screening for:  Breast cancer.  Cervical cancer.  Colorectal cancer.  Skin cancer.  Lung cancer. What should I know about heart disease, diabetes, and high blood pressure? Blood pressure and heart disease  High blood pressure causes heart disease and increases the risk of stroke. This is more likely to develop in people who have high blood pressure readings, are of African descent, or are overweight.  Have your blood pressure checked: ? Every 3-5 years if you are 36-72 years of age. ? Every year if you are 89 years old or older. Diabetes Have regular diabetes screenings. This checks your fasting blood sugar level. Have the screening done:  Once every three years after age 46 if you are at a normal weight and have a low risk for diabetes.  More often and at a younger age if you are overweight or have a high risk for diabetes. What should I know about preventing infection? Hepatitis B If you have a higher risk for hepatitis B, you should be screened for this virus. Talk with your health care provider to find out if you are at risk for hepatitis B infection. Hepatitis C Testing is recommended for:  Everyone born from [birth year] through 1964.  Anyone with known risk factors for hepatitis C. Sexually transmitted infections (STIs)  Get screened for STIs, including gonorrhea and chlamydia, if: ? You are sexually active and are younger than 52 years of age. ? You are older than 52 years of age  and your health care provider tells you that you are at risk for this type of infection. ? Your sexual activity has changed since you were last screened, and you are at increased risk for chlamydia or gonorrhea. Ask your health care provider if you are at risk.  Ask your health care provider about whether you are at high risk for HIV. Your health care provider may recommend a prescription medicine to help prevent HIV  infection. If you choose to take medicine to prevent HIV, you should first get tested for HIV. You should then be tested every 3 months for as long as you are taking the medicine. Pregnancy  If you are about to stop having your period (premenopausal) and you may become pregnant, seek counseling before you get pregnant.  Take 400 to 800 micrograms (mcg) of folic acid every day if you become pregnant.  Ask for birth control (contraception) if you want to prevent pregnancy. Osteoporosis and menopause Osteoporosis is a disease in which the bones lose minerals and strength with aging. This can result in bone fractures. If you are 68 years old or older, or if you are at risk for osteoporosis and fractures, ask your health care provider if you should:  Be screened for bone loss.  Take a calcium or vitamin D supplement to lower your risk of fractures.  Be given hormone replacement therapy (HRT) to treat symptoms of menopause. Follow these instructions at home: Lifestyle  Do not use any products that contain nicotine or tobacco, such as cigarettes, e-cigarettes, and chewing tobacco. If you need help quitting, ask your health care provider.  Do not use street drugs.  Do not share needles.  Ask your health care provider for help if you need support or information about quitting drugs. Alcohol use  Do not drink alcohol if: ? Your health care provider tells you not to drink. ? You are pregnant, may be pregnant, or are planning to become pregnant.  If you drink alcohol: ? Limit how much you use to 0-1 drink a day. ? Limit intake if you are breastfeeding.  Be aware of how much alcohol is in your drink. In the [place] one drink equals one 12 oz bottle of beer (355 mL), one 5 oz glass of wine (148 mL), or one 1 oz glass of hard liquor (44 mL). General instructions  Schedule regular health, dental, and eye exams.  Stay current with your vaccines.  Tell your health care provider if: ? You  often feel depressed. ? You have ever been abused or do not feel safe at home. Summary  Adopting a healthy lifestyle and getting preventive care are important in promoting health and wellness.  Follow your health care provider's instructions about healthy diet, exercising, and getting tested or screened for diseases.  Follow your health care provider's instructions on monitoring your cholesterol and blood pressure. This information is not intended to replace advice given to you by your health care provider. Make sure you discuss any questions you have with your health care provider. Document Released: 06/19/2011 Document Revised: 11/27/2018 Document Reviewed: 11/27/2018 Elsevier Patient Education  2020 [employer].

## 2019-10-28 NOTE — Assessment & Plan Note (Addendum)
Preventative protocols reviewed and updated unless pt declined. Discussed healthy diet and lifestyle.  

## 2019-10-28 NOTE — Assessment & Plan Note (Signed)
Encourage full smoking cessation. Precontemplative at this time.

## 2019-10-29 LAB — COMPREHENSIVE METABOLIC PANEL
ALT: 16 IU/L (ref 0–32)
AST: 12 IU/L (ref 0–40)
Albumin/Globulin Ratio: 1.3 (ref 1.2–2.2)
Albumin: 4.2 g/dL (ref 3.8–4.9)
Alkaline Phosphatase: 130 IU/L — ABNORMAL HIGH (ref 39–117)
BUN/Creatinine Ratio: 13 (ref 9–23)
BUN: 9 mg/dL (ref 6–24)
Bilirubin Total: <0.2 mg/dL (ref 0.0–1.2)
CO2: 19 mmol/L — ABNORMAL LOW (ref 20–29)
Calcium: 9.6 mg/dL (ref 8.7–10.2)
Chloride: 101 mmol/L (ref 96–106)
Creatinine, Ser: 0.7 mg/dL (ref 0.57–1.00)
GFR calc Af Amer: 115 mL/min/{1.73_m2} (ref >59)
GFR calc non Af Amer: 100 mL/min/{1.73_m2} (ref >59)
Globulin, Total: 3.2 g/dL (ref 1.5–4.5)
Glucose: 91 mg/dL (ref 65–99)
Potassium: 4 mmol/L (ref 3.5–5.2)
Sodium: 137 mmol/L (ref 134–144)
Total Protein: 7.4 g/dL (ref 6.0–8.5)

## 2019-10-29 LAB — LIPID PANEL
Chol/HDL Ratio: 5.1 ratio — ABNORMAL HIGH (ref 0.0–4.4)
Cholesterol, Total: 168 mg/dL (ref 100–199)
HDL: 33 mg/dL — ABNORMAL LOW (ref >39)
LDL Chol Calc (NIH): 110 mg/dL — ABNORMAL HIGH (ref 0–99)
Triglycerides: 137 mg/dL (ref 0–149)
VLDL Cholesterol Cal: 25 mg/dL (ref 5–40)

## 2019-10-29 LAB — TSH: TSH: 0.936 u[IU]/mL (ref 0.450–4.500)

## 2019-11-17 ENCOUNTER — Encounter: Payer: Self-pay | Admitting: Family Medicine

## 2020-05-13 ENCOUNTER — Ambulatory Visit: Payer: Managed Care, Other (non HMO) | Admitting: Family Medicine

## 2020-05-19 ENCOUNTER — Other Ambulatory Visit: Payer: Self-pay

## 2020-05-19 ENCOUNTER — Ambulatory Visit: Payer: Managed Care, Other (non HMO) | Admitting: Family Medicine

## 2020-05-19 ENCOUNTER — Encounter: Payer: Self-pay | Admitting: Family Medicine

## 2020-05-19 DIAGNOSIS — L237 Allergic contact dermatitis due to plants, except food: Secondary | ICD-10-CM

## 2020-05-19 MED ORDER — PREDNISONE 10 MG PO TABS
ORAL_TABLET | ORAL | 0 refills | Status: DC
Start: 2020-05-19 — End: 2020-07-05

## 2020-05-19 NOTE — Assessment & Plan Note (Signed)
On arms and R face- failing otc tx Px prednisone 30 mg taper-disc poss side eff inst to keep rash clean with soap/water  Keep cool Antihistamines prn  Wash all clothing /equip that touched the plant  Have someone pull plant out of area  Update if not starting to improve in a week or if worsening

## 2020-05-19 NOTE — Progress Notes (Signed)
Subjective:    Patient ID: Barbara Riley, female    DOB: 13-Dec-1966, 54 y.o.   MRN: [id number]  This visit occurred during the SARS-CoV-2 public health emergency.  Safety protocols were in place, including screening questions prior to the visit, additional usage of staff PPE, and extensive cleaning of exam room while observing appropriate contact time as indicated for disinfecting solutions.    HPI Pt presents with widespread rash - from poison ivy   54 yo pt of Dr Harlow Ohms Readings from Last 3 Encounters:  05/19/20 204 lb 5 oz (92.7 kg)  10/28/19 205 lb 5 oz (93.1 kg)  09/20/18 204 lb 8 oz (92.8 kg)   31.07 kg/m   A week ago Sunday- was cleaning around dog lot-clipping vines  Husband told her it was poison oak Started breaking out around R eye and face  Arms and L hip  None on palms of hands   This is the first time she has had it   She tried otc ivy rest  cetaphil Oatmeal bath   Took benadryl for itch  Zyrtec a few days   Patient Active Problem List   Diagnosis Date Noted  . 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 Trigger finger of right hand 09/20/2018  . Adjustment disorder 09/19/2017  . 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 Obesity, Class I, BMI 30-34.9 09/09/2015  . Prediabetes 08/27/2014  . Dyslipidemia 08/07/2014  . Healthcare maintenance 12/27/2011  . Smoker 12/27/2011   Past Medical History:  Diagnosis Date  . Anxiety    Stress with toddler and bills  . ANXIETY 04/15/2007   Qualifier: Diagnosis of  By: Silvio Pate MD, [facility]   . Carpal tunnel syndrome    mild right sided (R median sensory nerve only   Past Surgical History:  Procedure Laterality Date  . CESAREAN SECTION    . HYSTEROSCOPY WITH NOVASURE  08/2019   Dr Matthew Saras at [facility]  . MIDDLE EAR SURGERY  1985   perf repair on right   Social History   Tobacco Use  . Smoking status: Former Smoker    Packs/day: 0.25    Types: Cigarettes  . Smokeless tobacco: Never  Used  . Tobacco comment: 4-5 cigarettes daily  Substance Use Topics  . Alcohol use: No    Alcohol/week: 0.0 standard drinks  . Drug use: No   Family History  Problem Relation Age of Onset  . Heart disease Father 33       MI  . Stroke Father 74  . Stroke Paternal Grandfather   . Heart disease Unknown        GM  . Liver cancer Maternal Grandmother        GM   Allergies  Allergen Reactions  . Penicillins     REACTION: rash   Current Outpatient Medications on File Prior to Visit  Medication Sig Dispense Refill  . diphenhydrAMINE (BENADRYL) 25 mg capsule Take 1 capsule (25 mg total) by mouth every 6 (six) hours as needed.    Marland Kitchen escitalopram (LEXAPRO) 10 MG tablet Take 1 tablet by mouth daily.  0  . medroxyPROGESTERone (DEPO-PROVERA) 150 MG/ML injection Inject 150 mg into the muscle every 3 (three) months.    . pravastatin (PRAVACHOL) 10 MG tablet Take 1 tablet (10 mg total) by mouth daily. 90 tablet 3   No current facility-administered medications on file prior to visit.    Review of Systems  Constitutional: Negative for activity change,  appetite change, fatigue, fever and unexpected weight change.  HENT: Negative for congestion, ear pain, rhinorrhea, sinus pressure and sore throat.   Eyes: Negative for pain, redness and visual disturbance.  Respiratory: Negative for cough, shortness of breath and wheezing.   Cardiovascular: Negative for chest pain and palpitations.  Gastrointestinal: Negative for abdominal pain, blood in stool, constipation and diarrhea.  Endocrine: Negative for polydipsia and polyuria.  Genitourinary: Negative for dysuria, frequency and urgency.  Musculoskeletal: Negative for arthralgias, back pain and myalgias.  Skin: Positive for rash. Negative for pallor and wound.  Allergic/Immunologic: Negative for environmental allergies.  Neurological: Negative for dizziness, syncope and headaches.  Hematological: Negative for adenopathy. Does not bruise/bleed easily.    Psychiatric/Behavioral: Negative for decreased concentration and dysphoric mood. The patient is not nervous/anxious.        Objective:   Physical Exam Constitutional:      General: She is not in acute distress.    Appearance: Normal appearance. She is obese. She is not ill-appearing.  HENT:     Mouth/Throat:     Mouth: Mucous membranes are moist.  Eyes:     Conjunctiva/sclera: Conjunctivae normal.     Pupils: Pupils are equal, round, and reactive to light.  Cardiovascular:     Rate and Rhythm: Normal rate and regular rhythm.  Pulmonary:     Effort: Pulmonary effort is normal.     Breath sounds: No wheezing.  Musculoskeletal:     Cervical back: Normal range of motion and neck supple.  Lymphadenopathy:     Cervical: No cervical adenopathy.  Skin:    General: Skin is warm and dry.     Coloration: Skin is not jaundiced or pale.     Findings: Rash present.     Comments: Areas of vesicular rash in lines and patches on both arms and R side of face Few excoriations No swelling or drainage  No tenderness    Neurological:     Mental Status: She is alert.     Cranial Nerves: No cranial nerve deficit.  Psychiatric:        Mood and Affect: Mood normal.           Assessment & Plan:   Problem List Items Addressed This Visit      Musculoskeletal and Integument   Plant allergic contact dermatitis    On arms and R face- failing otc tx Px prednisone 30 mg taper-disc poss side eff inst to keep rash clean with soap/water  Keep cool Antihistamines prn  Wash all clothing /equip that touched the plant  Have someone pull plant out of area  Update if not starting to improve in a week or if worsening

## 2020-05-19 NOTE — Patient Instructions (Addendum)
Keep cool  Keep rash clean with soap and water   Antihistamine like zyrtec or benadryl helps   Take prednisone as directed  It may make you feel hyper and hungry

## 2020-07-05 ENCOUNTER — Other Ambulatory Visit: Payer: Self-pay

## 2020-07-05 ENCOUNTER — Encounter: Payer: Self-pay | Admitting: Family Medicine

## 2020-07-05 ENCOUNTER — Ambulatory Visit: Payer: Managed Care, Other (non HMO) | Admitting: Family Medicine

## 2020-07-05 VITALS — BP 110/76 | HR 80 | Temp 98.3°F | Ht 68.0 in | Wt 205.0 lb

## 2020-07-05 DIAGNOSIS — W57XXXA Bitten or stung by nonvenomous insect and other nonvenomous arthropods, initial encounter: Secondary | ICD-10-CM

## 2020-07-05 DIAGNOSIS — R200 Anesthesia of skin: Secondary | ICD-10-CM | POA: Diagnosis not present

## 2020-07-05 DIAGNOSIS — S60562A Insect bite (nonvenomous) of left hand, initial encounter: Secondary | ICD-10-CM

## 2020-07-05 NOTE — Patient Instructions (Signed)
Bee, Wasp, or Hornet Sting, Adult Bees, wasps, and hornets are part of a family of insects that can sting people. These stings can cause pain and inflammation, but they are usually not serious. However, some people may have an allergic reaction to a sting. This can cause the symptoms to be more severe. What increases the risk? You may be at a greater risk of getting stung if you:  Provoke a stinging insect by swatting or disturbing it.  Wear strong-smelling soaps, deodorants, or body sprays.  Spend time outdoors near gardens with flowers or fruit trees or in clothes that expose skin.  Eat or drink outside. What are the signs or symptoms? Common symptoms of this condition include:  A red lump in the skin that sometimes has a tiny hole in the center. In some cases, a stinger may be in the center of the wound.  Pain and itching at the sting site.  Redness and swelling around the sting site. If you have an allergic reaction (localized allergic reaction), the swelling and redness may spread out from the sting site. In some cases, this reaction can continue to develop over the next 24-48 hours. In rare cases, a person may have a severe allergic reaction (anaphylactic reaction) to a sting. Symptoms of an anaphylactic reaction may include:  Wheezing or difficulty breathing.  Raised, itchy, red patches on the skin (hives).  Nausea or vomiting.  Abdominal cramping.  Diarrhea.  Tightness in the chest or chest pain.  Dizziness or fainting.  Redness of the face (flushing).  Hoarse voice.  Swollen tongue, lips, or face. How is this diagnosed? This condition is usually diagnosed based on your symptoms and medical history as well as a physical exam. You may have an allergy test to determine if you are allergic to the substance that the insect injected during the sting (venom). How is this treated? If you were stung by a bee, the stinger and a small sac of venom may be in the wound. It is  important to remove the stinger as soon as possible. You can do this by brushing across the wound with gauze, a fingernail, or a flat card such as a credit card. Removing the stinger can help reduce the severity of your body's reaction to the sting. Most stings can be treated with:  Icing to reduce swelling in the area.  Medicines (antihistamines) to treat itching or an allergic reaction.  Medicines to help reduce pain. These may be medicines that you take by mouth, or medicated creams or lotions that you apply to your skin. Pay close attention to your symptoms after you have been stung. If possible, have someone stay with you to make sure you do not have an allergic reaction. If you have any signs of an allergic reaction, call your health care provider. If you have ever had a severe allergic reaction, your health care provider may give you an inhaler or injectable medicine (epinephrine auto-injector) to use if necessary. Follow these instructions at home:   Wash the sting site 2-3 times each day with soap and water as told by your health care provider.  Apply or take over-the-counter and prescription medicines only as told by your health care provider.  If directed, apply ice to the sting area. ? Put ice in a plastic bag. ? Place a towel between your skin and the bag. ? Leave the ice on for 20 minutes, 2-3 times a day.  Do not scratch the sting area.  If   you had a severe allergic reaction to a sting, you may need: ? To wear a medical bracelet or necklace that lists the allergy. ? To learn when and how to use an anaphylaxis kit or epinephrine injection. Your family members and coworkers may also need to learn this. ? To carry an anaphylaxis kit or epinephrine injection with you at all times. How is this prevented?  Avoid swatting at stinging insects and disturbing insect nests.  Do not use fragrant soaps or lotions.  Wear shoes, pants, and long sleeves when spending time outdoors,  especially in grassy areas where stinging insects are common.  Keep outdoor areas free from nests or hives.  Keep food and drink containers covered when eating outdoors.  Avoid working or sitting near flowering plants, if possible.  Wear gloves if you are gardening or working outdoors.  If an attack by a stinging insect or a swarm seems likely in the moment, move away from the area or find a barrier between you and the insect(s), such as a door. Contact a health care provider if:  Your symptoms do not get better in 2-3 days.  You have redness, swelling, or pain that spreads beyond the area of the sting.  You have a fever. Get help right away if: You have symptoms of a severe allergic reaction. These include:  Wheezing or difficulty breathing.  Tightness in the chest or chest pain.  Light-headedness or fainting.  Itchy, raised, red patches on the skin.  Nausea or vomiting.  Abdominal cramping.  Diarrhea.  A swollen tongue or lips, or trouble swallowing.  Dizziness or fainting. Summary  Stings from bees, wasps, and hornets can cause pain and inflammation, but they are usually not serious. However, some people may have an allergic reaction to a sting. This can cause the symptoms to be more severe.  Pay close attention to your symptoms after you have been stung. If possible, have someone stay with you to make sure you do not have an allergic reaction.  Call your health care provider if you have any signs of an allergic reaction. This information is not intended to replace advice given to you by your health care provider. Make sure you discuss any questions you have with your health care provider. Document Revised: 11/29/2017 Document Reviewed: 02/08/2017 Elsevier Patient Education  2020 Elsevier Inc.  

## 2020-07-05 NOTE — Progress Notes (Signed)
   Subjective:    Patient ID: Barbara Riley, female    DOB: 02/25/1966, 54 y.o.   MRN: [id number]  HPI Chief Complaint  Patient presents with  . Wasp Sting on Left Hand    Was stung yesterday. Left hand is swollen. Has been taking Benadryl ever 4 hours.   This is a 54 yo female who presents today with above cc. Applied vanilla extract and baking soda, took ibuprofen 600 mg and diphenhydramine 25 mg with little relief. No redness, drainage. Hand swollen.   Left hand numbness, tingling fingers, worse in thumb over last 3 weeks. History of carpel tunnel. Wears braces. No neck pain. Has not seen hand specialist.    Review of Systems    per HPI Objective:   Physical Exam Vitals reviewed.  Constitutional:      General: She is not in acute distress.    Appearance: Normal appearance. She is obese. She is not ill-appearing, toxic-appearing or diaphoretic.  HENT:     Head: Normocephalic and atraumatic.  Cardiovascular:     Rate and Rhythm: Normal rate.  Pulmonary:     Effort: Pulmonary effort is normal.  Skin:    General: Skin is warm and dry.     Comments: Left hand with generalized edema. No erythema, no warmth, drainage. Fingers/ hand with full ROM. Brisk cap refill.   Neurological:     Mental Status: She is alert and oriented to person, place, and time.  Psychiatric:        Mood and Affect: Mood normal.        Behavior: Behavior normal.        Thought Content: Thought content normal.        Judgment: Judgment normal.          BP 110/76 (BP Location: Right Arm, Patient Position: Sitting, Cuff Size: Large)   Pulse 80   Temp 98.3 F (36.8 C)   Ht 5\' 8"  (1.727 m)   Wt 205 lb (93 kg)   SpO2 98%   BMI 31.17 kg/m  Wt Readings from Last 3 Encounters:  07/05/20 205 lb (93 kg)  05/19/20 204 lb 5 oz (92.7 kg)  10/28/19 205 lb 5 oz (93.1 kg)    Assessment & Plan:  1. Insect bite of left hand, initial encounter - Provided written and verbal information regarding diagnosis  and treatment. - Discussed pros/ cons of steroid injection, she will hold off - add long acting antihistamine to short acting, can alternate NSAIDs with acetaminophen - elevate above heart - follow up precautions reviewed  2. Numbness of left hand - difficult to examine today due to insect bite/ swelling - offered referral to hand specialist, she declines at this time  This visit occurred during the SARS-CoV-2 public health emergency.  Safety protocols were in place, including screening questions prior to the visit, additional usage of staff PPE, and extensive cleaning of exam room while observing appropriate contact time as indicated for disinfecting solutions.      Clarene Reamer, FNP-BC  [facility]  07/05/2020 2:40 PM

## 2020-08-09 DIAGNOSIS — G5602 Carpal tunnel syndrome, left upper limb: Secondary | ICD-10-CM | POA: Insufficient documentation

## 2020-08-17 DIAGNOSIS — G5601 Carpal tunnel syndrome, right upper limb: Secondary | ICD-10-CM | POA: Insufficient documentation

## 2020-08-29 ENCOUNTER — Emergency Department (HOSPITAL_COMMUNITY)
Admission: EM | Admit: 2020-08-29 | Discharge: 2020-08-29 | Disposition: A | Discharge status: Home / Self Care | Payer: Managed Care, Other (non HMO) | Attending: Emergency Medicine | Admitting: Emergency Medicine

## 2020-08-29 ENCOUNTER — Other Ambulatory Visit: Payer: Self-pay

## 2020-08-29 ENCOUNTER — Emergency Department (HOSPITAL_COMMUNITY): Payer: Managed Care, Other (non HMO)

## 2020-08-29 ENCOUNTER — Encounter (HOSPITAL_COMMUNITY): Payer: Self-pay | Admitting: Emergency Medicine

## 2020-08-29 DIAGNOSIS — Z5321 Procedure and treatment not carried out due to patient leaving prior to being seen by health care provider: Secondary | ICD-10-CM | POA: Diagnosis not present

## 2020-08-29 DIAGNOSIS — R0981 Nasal congestion: Secondary | ICD-10-CM | POA: Insufficient documentation

## 2020-08-29 DIAGNOSIS — R5383 Other fatigue: Secondary | ICD-10-CM | POA: Insufficient documentation

## 2020-08-29 LAB — COMPREHENSIVE METABOLIC PANEL
ALT: 28 U/L (ref 0–44)
AST: 21 U/L (ref 15–41)
Albumin: 4.1 g/dL (ref 3.5–5.0)
Alkaline Phosphatase: 105 U/L (ref 38–126)
Anion gap: 11 (ref 5–15)
BUN: 8 mg/dL (ref 6–20)
CO2: 22 mmol/L (ref 22–32)
Calcium: 9.3 mg/dL (ref 8.9–10.3)
Chloride: 104 mmol/L (ref 98–111)
Creatinine, Ser: 0.55 mg/dL (ref 0.44–1.00)
GFR calc Af Amer: >60 mL/min (ref >60)
GFR calc non Af Amer: >60 mL/min (ref >60)
Glucose, Bld: 151 mg/dL — ABNORMAL HIGH (ref 70–99)
Potassium: 3.6 mmol/L (ref 3.5–5.1)
Sodium: 137 mmol/L (ref 135–145)
Total Bilirubin: 0.3 mg/dL (ref 0.3–1.2)
Total Protein: 7.5 g/dL (ref 6.5–8.1)

## 2020-08-29 LAB — CBC WITH DIFFERENTIAL/PLATELET
Abs Immature Granulocytes: 0.01 10*3/uL (ref 0.00–0.07)
Basophils Absolute: 0 10*3/uL (ref 0.0–0.1)
Basophils Relative: 1 %
Eosinophils Absolute: 0 10*3/uL (ref 0.0–0.5)
Eosinophils Relative: 1 %
HCT: 46.1 % — ABNORMAL HIGH (ref 36.0–46.0)
Hemoglobin: 14.8 g/dL (ref 12.0–15.0)
Immature Granulocytes: 0 %
Lymphocytes Relative: 21 %
Lymphs Abs: 1.3 10*3/uL (ref 0.7–4.0)
MCH: 28.7 pg (ref 26.0–34.0)
MCHC: 32.1 g/dL (ref 30.0–36.0)
MCV: 89.5 fL (ref 80.0–100.0)
Monocytes Absolute: 0.6 10*3/uL (ref 0.1–1.0)
Monocytes Relative: 9 %
Neutro Abs: 4.4 10*3/uL (ref 1.7–7.7)
Neutrophils Relative %: 68 %
Platelets: 261 10*3/uL (ref 150–400)
RBC: 5.15 MIL/uL — ABNORMAL HIGH (ref 3.87–5.11)
RDW: 15.1 % (ref 11.5–15.5)
WBC: 6.3 10*3/uL (ref 4.0–10.5)
nRBC: 0 % (ref 0.0–0.2)

## 2020-08-29 NOTE — ED Triage Notes (Signed)
Pt states she had COVID vaccine 8/31. States on Labor Day she began feeling extremely fatigued and has had nasal congestion. States that her son had tested positive a week prior to receiving vaccine on the 31st. Pt tearful in triage.

## 2020-08-29 NOTE — ED Notes (Signed)
Per registration, pt left at 1510

## 2020-08-30 ENCOUNTER — Telehealth: Payer: Self-pay

## 2020-08-30 ENCOUNTER — Other Ambulatory Visit: Payer: Self-pay | Admitting: Family Medicine

## 2020-08-30 ENCOUNTER — Encounter: Payer: Self-pay | Admitting: Family Medicine

## 2020-08-30 ENCOUNTER — Telehealth (INDEPENDENT_AMBULATORY_CARE_PROVIDER_SITE_OTHER): Payer: Managed Care, Other (non HMO) | Admitting: Family Medicine

## 2020-08-30 VITALS — BP 139/87 | HR 99 | Temp 98.0°F | Ht 68.0 in | Wt 200.0 lb

## 2020-08-30 DIAGNOSIS — R059 Cough, unspecified: Secondary | ICD-10-CM

## 2020-08-30 DIAGNOSIS — R05 Cough: Secondary | ICD-10-CM

## 2020-08-30 DIAGNOSIS — J189 Pneumonia, unspecified organism: Secondary | ICD-10-CM | POA: Diagnosis not present

## 2020-08-30 MED ORDER — ALBUTEROL SULFATE HFA 108 (90 BASE) MCG/ACT IN AERS: 1.0000 | INHALATION_SPRAY | Freq: Four times a day (QID) | RESPIRATORY_TRACT | 1 refills | Status: DC | PRN

## 2020-08-30 MED ORDER — LEVOFLOXACIN 750 MG PO TABS: 750.0000 mg | ORAL_TABLET | Freq: Every day | ORAL | 0 refills | Status: DC

## 2020-08-30 NOTE — Progress Notes (Signed)
Interactive audio and video telecommunications were attempted between this provider and patient, however failed, due to patient having technical difficulties OR patient did not have access to video capability.  We continued and completed visit with audio only.   Virtual Visit via Telephone Note  I connected with patient on 08/30/20  at 3:22 PM  by telephone and verified that I am speaking with the correct person using two identifiers.  Location of patient:  Home  Location of MD: [facility] Name of referring provider (if blank then none associated): Names per persons and role in encounter:  MD: Earlyne Iba, Patient: name listed above.    I discussed the limitations, risks, security and privacy concerns of performing an evaluation and management service by telephone and the availability of in person appointments. I also discussed with the patient that there may be a patient responsible charge related to this service. The patient expressed understanding and agreed to proceed.  CC: cough.  History of Present Illness:   She had seen ortho clinic, was given course of prednisone.    Later on, had 1st dose of covid vaccine on 08/17/20.    08/23/20- 1 week ago- stepped on a bee.  Local reaction only initially.  08/25/20 she had whelps on her torso.  Took benadryl, used topical benadryl.  In the meantime had fatigue.  Husband is stick with cough.  Still taking fluids.  Slept most of yesterday and today.  No fevers.  Cough started about 1 week ago.  No sputum.  Scant wheeze, with coughing fits.  No h/o inhaler use.  Labs d/w pt from ER.  CXR d/w pt.  She had to leave ER due to wait.    CXR yesterday with: Subtle patchy airspace opacity in the right upper lung may represent early bronchopneumonia. Recommend follow-up chest x-ray in 4-6 weeks after an appropriate course of therapy to ensure resolution and exclude the possibility of underlying pulmonary nodule/neoplasm.  Mild bilateral bronchitic  changes are favored to be chronic in nature.    Observations/Objective: No apparent distress.  Speech normal.  Assessment and Plan: Cough.  X-ray discussed with patient.  Presumed pneumonia. Needs CXR here at [facility] in about 6 weeks.  Discussed with patient.  She can schedule this. Reasonable to get covid test when possible.  D/w pt.   Work note d/w pt.  I don't have papers yet- pt will check with HR department.  I am awaiting that.  I will fill it out when I get it.  I would expect her to be out of work this week.   Start levaquin.  Use SABA prn.   Rest and fluids.   Routine cautions given to patient.  She agrees.  Still okay for outpatient follow-up  Follow Up Instructions: see above.    I discussed the assessment and treatment plan with the patient. The patient was provided an opportunity to ask questions and all were answered. The patient agreed with the plan and demonstrated an understanding of the instructions.   The patient was advised to call back or seek an in-person evaluation if the symptoms worsen or if the condition fails to improve as anticipated.  I provided 15 minutes of non-face-to-face time during this encounter.  Elsie Stain, MD

## 2020-08-30 NOTE — Telephone Encounter (Signed)
[name] - Client TELEPHONE ADVICE RECORD [facility] Patient Name: Barbara Riley Gender: Female DOB: [birth year] Age: 54 Y 10 M 28 D Return Phone Number: [phone] (Primary), [phone] (Secondary) Address[place] [address] Client [facility] Night - Client Client Site [facility] Physician [name] - MD Contact Type Call Who Is Calling Patient / Member / Family / Caregiver Call Type Triage / Clinical Relationship To Patient Self Return Phone Number [phone] (Primary) Chief Complaint Cough Reason for Call Symptomatic / Request for Barbara Riley states he got covid shot Aug 31. She had a cortisone shot on her wrist the day before. She stats labor day Monday she was stung and she has been sick ever since. She states she went to urgent care because she developed welts on her waist line. She can barley keep her head up and is coughing and has no energy. Translation No Nurse Assessment Nurse: Noberto Retort, RN, Malvin Johns Date/Time Eilene Ghazi Time): 08/29/2020 12:18:09 PM Confirm and document reason for call. If symptomatic, describe symptoms. ---Caller states he got covid shot Aug 31. She had a cortisone shot on her wrist the day before. She stats labor day Monday she was stung and she has been sick ever since. She states she went to urgent care because she developed welts on her waist line. She can barley keep her head up and is coughing and has no energy. s/ s started monday. No other s/s at this time. Has the patient had close contact with a person known or suspected to have the novel coronavirus illness OR traveled / lives in area with major community spread (including international travel) in the last 14 days from the onset of symptoms? * If Asymptomatic, screen for exposure and travel within the last 14 days. ---Yes Does the patient have any  new or worsening symptoms? ---Yes Will a triage be completed? ---Yes Related visit to physician within the last 2 weeks? ---No Does the PT have any chronic conditions? (i.e. diabetes, asthma, this includes High risk factors for pregnancy, etc.) ---Yes List chronic conditions. ---Hyperlipidemia Is the patient pregnant or possibly pregnant? (Ask all females between the ages of 55-55) ---No Is this a behavioral health or substance abuse call? ---NoPLEASE NOTE: All timestamps contained within this report are represented as [place] Standard Time. CONFIDENTIALTY NOTICE: This fax transmission is intended only for the addressee. It contains information that is legally privileged, confidential or otherwise protected from use or disclosure. If you are not the intended recipient, you are strictly prohibited from reviewing, disclosing, copying using or disseminating any of this information or taking any action in reliance on or regarding this information. If you have received this fax in error, please notify us immediately by telephone so that we can arrange for its return to [place]. Phone: [phone], Toll-Free: [phone], Fax: [phone] Page: 2 of 2 Call Id: [id number] Guidelines Guideline Title Affirmed Question Affirmed Notes Nurse Date/Time Eilene Ghazi Time) COVID-19 - Diagnosed or Suspected MILD difficulty breathing (e.g., minimal/no SOB at rest, SOB with walking, pulse <100) Noberto Retort, RN, Malvin Johns 08/29/2020 12:23:32 PM Disp. Time Eilene Ghazi Time) Disposition Final User 08/29/2020 12:25:48 PM Go to ED Now (or PCP triage) Yes Noberto Retort, RN, Renne Musca Disagree/Comply Comply Caller Understands Yes PreDisposition InappropriateToAsk Care Advice Given Per Guideline GO TO ED NOW (OR PCP TRIAGE): * IF NO PCP (PRIMARY CARE PROVIDER) SECOND-LEVEL TRIAGE: You need to be seen within the next hour. Go to  the ED/UCC at _____________ [place] as soon as you can. NOTE TO TRIAGER - IF NO PCP, HAVE OTHER  HCP RE-TRIAGE THE PATIENT, IF AVAILABLE: * During this COVID-19 pandemic, the medical community is trying to prevent unnecessary referrals to the emergency department (ED). Some patients are fearful of being exposed to COVID-19 in a medical setting. Second-level triage (re-triage) by a physician has been shown to reduce ED referrals. Here are resources that may be available in your community. * Cough: Use cough drops. * Feeling dehydrated: Drink extra liquids. If the air in your home is dry, use a humidifier. * The treatment is the same whether you have COVID-19, influenza or some other respiratory virus. GENERAL CARE ADVICE FOR COVID-19 SYMPTOMS: CARE ADVICE given per COVID-19 - DIAGNOSED OR SUSPECTED (Adult) guideline. Referrals GO TO FACILITY UNDECIDED

## 2020-08-30 NOTE — Telephone Encounter (Signed)
Noted. Seen at ER yesterday, LWBS. ER records reviewed - see below.  Glad she's seeing Dr Damita Dunnings this afternoon for virtual visit.   DG Chest 2 View CLINICAL DATA:  COVID exposure, fatigue, nasal congestion, short of breath  EXAM: CHEST - 2 VIEW  COMPARISON:  None.  FINDINGS: Subtle patchy airspace opacity in the right upper lung. Mild bronchitic changes bilaterally. No pleural effusion or pneumothorax. Cardiac and mediastinal contours are within normal limits. No acute fracture or lytic or blastic osseous lesions. The visualized upper abdominal bowel gas pattern is unremarkable.  IMPRESSION: Subtle patchy airspace opacity in the right upper lung may represent early bronchopneumonia. Recommend follow-up chest x-ray in 4-6 weeks after an appropriate course of therapy to ensure resolution and exclude the possibility of underlying pulmonary nodule/neoplasm.  Mild bilateral bronchitic changes are favored to be chronic in nature.  Electronically Signed   By: Jacqulynn Cadet M.D.   On: 08/29/2020 14:36

## 2020-08-31 ENCOUNTER — Telehealth: Payer: Self-pay

## 2020-08-31 ENCOUNTER — Telehealth: Payer: Self-pay | Admitting: Family Medicine

## 2020-08-31 NOTE — Telephone Encounter (Signed)
Left message asking pt to call office Regarding fmla See robin

## 2020-08-31 NOTE — Telephone Encounter (Signed)
See office visit note.  Thanks. 

## 2020-08-31 NOTE — Telephone Encounter (Signed)
Patient returned phone call from [place] regarding [place]. FYI

## 2020-09-01 DIAGNOSIS — J189 Pneumonia, unspecified organism: Secondary | ICD-10-CM | POA: Insufficient documentation

## 2020-09-01 NOTE — Telephone Encounter (Signed)
Left message asking pt to call office When was first day out of work and when does pt plan on returning to work??

## 2020-09-01 NOTE — Assessment & Plan Note (Signed)
Cough.  X-ray discussed with patient.  Presumed pneumonia. Needs CXR here at [facility] in about 6 weeks.  Discussed with patient.  She can schedule this. Reasonable to get covid test when possible.  D/w pt.   Work note d/w pt.  I don't have papers yet- pt will check with HR department.  I am awaiting that.  I will fill it out when I get it.  I would expect her to be out of work this week.   Start levaquin.  Use SABA prn.   Rest and fluids.   Routine cautions given to patient.  She agrees.  Still okay for outpatient follow-up

## 2020-09-02 ENCOUNTER — Telehealth: Payer: Self-pay

## 2020-09-02 NOTE — Telephone Encounter (Signed)
I spoke with pt; pt started Levoquin 750 mg taking one daily; pt is wondering if levaquin 750 mg is too strong for pt. Pt was advised to take in the morning by access nurse and pt has been taking at 7pm at night. Pt said about 1 hr after taking at night pt feels nervous and cannot sleep; pt having diarrhea but since diarrhea started pt has been taking abx with food and BM is not diarrhea but BM is loose; pt said she is not getting her energy level back; pt drinking fruit juice,pediatlyte and an occasional ensure. Pt said she is used to drinking sweet tea and mountain dew and wonders if lack of caffeine could cause pt to have no energy. Pt wonders if abx would cause her to feel this way. Pt wants to know if should take abx in AM or take different abx. Pt request cb. Did video visit with Dr Damita Dunnings on 08/30/20.

## 2020-09-02 NOTE — Telephone Encounter (Signed)
Pneumonia and caffeine withdrawal could contribute to recent sx.  Rec: rest and fluids.  I would still finish the antibiotics, I wouldn't change meds.  She can change to AM dosing if needed.  If she is going to change to AM dosing, then don't take tonight and begin taking in the AM tomorrow.  Thanks.

## 2020-09-02 NOTE — Telephone Encounter (Signed)
Barbara Riley - Client TELEPHONE ADVICE RECORD Access[facility]urse Patient Name: Barbara Riley Gender: Female DOB: May 30, 1966 Age: 54 Y 11 M 1 D Return Phone Number: [phone] (Primary) [place] [address] Client [facility] [place] [facility] - Client Client Site [place] Physician Renford Dills - MD Contact Type Call Who Is Calling Patient / Member / Family / Caregiver Call Type Triage / Clinical Relationship To Patient Self Return Phone Number [phone] (Primary) Chief Complaint Medication reaction Reason for Call Symptomatic / Request for [place] was dx with pnumonia, thinks medication is too much, and is exhausted. Translation No Nurse Assessment Nurse: Raenette Rover, RN, Zella Ball Date/Time (Eastern Time): 09/02/2020 2:56:21 PM Confirm and document reason for call. If symptomatic, describe symptoms. ---dx with pneumonia Monday , thinks medication is too much, and is exhausted Has the patient had close contact with a person known or suspected to have the novel coronavirus illness OR traveled / lives in area with major community spread (including international travel) in the last 14 days from the onset of symptoms? * If Asymptomatic, screen for exposure and travel within the last 14 days. ---No Does the patient have any new or worsening symptoms? ---Yes Will a triage be completed? ---Yes Related visit to physician within the last 2 weeks? ---Yes Does the PT have any chronic conditions? (i.e. diabetes, asthma, this includes High risk factors for pregnancy, etc.) ---No Is the patient pregnant or possibly pregnant? (Ask all females between the ages of 48-55) ---No Is this a behavioral health or substance abuse call? ---No Guidelines Guideline Title Affirmed Question Affirmed Notes Nurse Date/Time (Eastern Time) Pneumonia on Antibiotic Follow-up Call Reasonable  improvement on antibiotic [name] 09/02/2020 2:58:22 PM Disp. Time Eilene Ghazi Time) Disposition Final User 09/02/2020 3:03:47 [place], RN, Zella Ball PLEASE NOTE: All timestamps contained within this report are represented as [place] Standard Time. CONFIDENTIALTY NOTICE: This fax transmission is intended only for the addressee. It contains information that is legally privileged, confidential or otherwise protected from use or disclosure. If you are not the intended recipient, you are strictly prohibited from reviewing, disclosing, copying using or disseminating any of this information or taking any action in reliance on or regarding this information. If you have received this fax in error, please notify us immediately by telephone so that we can arrange for its return to [place]. Phone: [phone], Toll-Free: [phone], Fax: [phone] Page: 2 of 2 Call Id: [id number] [place] Disagree/Comply Comply Caller Understands Yes PreDisposition Did not know what to do Care Advice Given Per Guideline HOME CARE: * Continue the prescribed antibiotic. CONTINUE ANTIBIOTIC: * IBUPROFEN (E.G., MOTRIN, ADVIL): Take 400 mg (two 200 mg pills) by mouth every 6 hours. The most you should take each Riley is 1,200 mg (six 200 mg pills), unless your doctor has told you to take more. HUMIDIFIER: * Dry air makes coughs worse. * Breathing becomes more difficult * Breathing not back to normal by 1 week * Fever lasts over 48 hours (2 days) on antibiotics * You become worse CALL BACK IF: CARE ADVICE given per Pneumonia on Antibiotic Follow-Up Call (Adult) guideline. Comments User: Wilson Singer, RN Date/Time Eilene Ghazi Time): 09/02/2020 2:57:30 PM Levaquin 750 mg once daily

## 2020-09-02 NOTE — Telephone Encounter (Signed)
Left detailed message on voicemail.  

## 2020-09-02 NOTE — Telephone Encounter (Signed)
Spoke with pt first day out of work 08/26/20.  She stated she still is not feeling well.  She stated she is exhausted all the time.  She has questions regarding her meds.  She thinks the meds are to much and wiping her out.  She wanted to talk to a nurse and I transferred her triage to get more detailed information.  Paperwork in dr [employer] in box

## 2020-09-03 NOTE — Telephone Encounter (Signed)
Pt returned your call.  

## 2020-09-03 NOTE — Telephone Encounter (Signed)
Pt wanted to know if it is ok for her to take 2nd covid vaccine it is scheduled 9/22

## 2020-09-03 NOTE — Telephone Encounter (Signed)
See other phone note regarding medications/advice.  I will work on the [employer].  Thanks.

## 2020-09-03 NOTE — Telephone Encounter (Signed)
Was she tested for COVID?  Would probably recommend she postpone vaccine by 1 week - would want to ensure she's feeling better from respiratory standpoint prior to getting shot.

## 2020-09-06 NOTE — Telephone Encounter (Signed)
Recently treated for CAP PNA.  As long as fever free and respiratory symptoms improved, I think reasonable to return to work. Will cc Dr Damita Dunnings who saw her for this.

## 2020-09-06 NOTE — Telephone Encounter (Signed)
Pt called and wants to know if a decision has been reached on her return to work date.  Please advise.  Thank you!

## 2020-09-06 NOTE — Telephone Encounter (Signed)
Spoke with pt had 2 COVID test 09/03/20, both resulted negative same day.  I relayed Dr. Synthia Innocent message.  Pt verbalizes understanding.  She's asking when should she return to work. Plz advise.

## 2020-09-07 NOTE — Telephone Encounter (Signed)
Left detailed message on voicemail.  

## 2020-09-07 NOTE — Telephone Encounter (Signed)
Lvm asking pt to call back.  Need to inform pt Dr. Darnell Level is ok with her returning to work as long as she is fever free and respiratory sxs have improved.  She can let Dr. Damita Dunnings know if she needs a work note.

## 2020-09-07 NOTE — Telephone Encounter (Signed)
Agree with return to work when she feels well enough.  If she needs a note with a specific date then please let me know.  Thanks.

## 2020-09-07 NOTE — Telephone Encounter (Signed)
She can go back to work when she feels well enough to return.  Okay to return sooner than planned if feeling up to it.  Thanks.

## 2020-09-07 NOTE — Telephone Encounter (Signed)
Pt called to check on her paperwork.   Pt stated she is feeling better and wanted to know when she can go back to work.  She stated she wears out pretty quick.    [employer] has her out of work until 9/29 unless you stated different

## 2020-09-08 NOTE — Telephone Encounter (Signed)
Paperwork faxed Left message asking pt to call office

## 2020-09-08 NOTE — Telephone Encounter (Signed)
Lvm asking pt to call back.  Need to inform pt Dr. Darnell Level is ok with her returning to work as long as she is fever free and respiratory sxs have improved.  She can let Dr. Damita Dunnings know if she needs a work note.

## 2020-09-09 NOTE — Telephone Encounter (Signed)
Left message asking pt to call office   What is her email address and does she need her fmla emailed to her

## 2020-09-09 NOTE — Telephone Encounter (Signed)
Pt would like for you to give her a call back. She said she needs an email from [place] to send to her employer.

## 2020-09-10 NOTE — Telephone Encounter (Signed)
I cannot email the letter. I did the letter in the EMR and she can get it via MyChart or a can be printed off and sent to the patient. Thanks.

## 2020-09-10 NOTE — Telephone Encounter (Signed)
Lvm asking pt to call back.  Need to relay Dr. Josefine Class message.

## 2020-09-10 NOTE — Telephone Encounter (Signed)
Pt called and requested a work release note be emailed to her so she can go back to work Monday, 09/13/2020.  You can email to [email or web address]  Thank you!

## 2020-09-10 NOTE — Telephone Encounter (Signed)
Pt called and requested that her FMLA papers be mailed to [email or web address]  Thank you!

## 2020-09-10 NOTE — Telephone Encounter (Signed)
Put paperwork on Barbara Riley's desk to be emailed

## 2020-09-10 NOTE — Telephone Encounter (Signed)
Lvm asking pt to call back.  Need to inform pt Dr. Darnell Level is ok with her returning to work as long as she is fever free and respiratory sxs have improved.  She can let Dr. Damita Dunnings know if she needs a work note.  Mailing a letter.

## 2020-09-13 NOTE — Telephone Encounter (Signed)
Lvm asking pt to call back.  Need to relay Dr. Josefine Class message.

## 2020-09-14 NOTE — Telephone Encounter (Signed)
Lvm asking pt to call back.  Need to relay Dr. Josefine Class message.

## 2020-09-15 NOTE — Telephone Encounter (Signed)
Pt returning call.  I relayed Dr. Phoebe Sharps message.  Pt states she was able to get letter from her MyChart.

## 2020-11-22 ENCOUNTER — Other Ambulatory Visit: Payer: Self-pay | Admitting: Family Medicine

## 2020-11-24 ENCOUNTER — Telehealth: Payer: Self-pay | Admitting: Family Medicine

## 2020-11-24 DIAGNOSIS — J189 Pneumonia, unspecified organism: Secondary | ICD-10-CM

## 2020-11-24 NOTE — Telephone Encounter (Signed)
Pt returned call. She said she will come Friday after 2pm to get xray. If you need to call her back you can.

## 2020-11-24 NOTE — Telephone Encounter (Signed)
Thanks.  Orders in.  Routed to lab as FYI.

## 2020-11-24 NOTE — Telephone Encounter (Signed)
Please update patient.  I thought she was going to come in for follow-up x-ray to make sure that her previous pneumonia had cleared and that she did not have any residual abnormalities on subsequent x-ray.  Please encourage her to do so.

## 2020-11-24 NOTE — Telephone Encounter (Signed)
Called and left voicemail for patient to return call to office.  °

## 2020-12-27 NOTE — Telephone Encounter (Signed)
Reordered.  Thanks.

## 2020-12-27 NOTE — Addendum Note (Signed)
Addended by: Tonia Ghent on: 12/27/2020 02:41 PM   Modules accepted: Orders

## 2020-12-27 NOTE — Telephone Encounter (Signed)
Patient called in stating she would like to do chest xray on 1/14. See orders canceled. Would you be able to reorder this?

## 2020-12-31 ENCOUNTER — Other Ambulatory Visit (INDEPENDENT_AMBULATORY_CARE_PROVIDER_SITE_OTHER): Payer: Self-pay

## 2020-12-31 ENCOUNTER — Telehealth: Payer: Self-pay

## 2020-12-31 ENCOUNTER — Other Ambulatory Visit: Payer: Self-pay

## 2020-12-31 ENCOUNTER — Other Ambulatory Visit: Payer: Self-pay | Admitting: Family Medicine

## 2020-12-31 DIAGNOSIS — Z1159 Encounter for screening for other viral diseases: Secondary | ICD-10-CM

## 2020-12-31 DIAGNOSIS — R7303 Prediabetes: Secondary | ICD-10-CM

## 2020-12-31 DIAGNOSIS — E785 Hyperlipidemia, unspecified: Secondary | ICD-10-CM

## 2020-12-31 LAB — COMPREHENSIVE METABOLIC PANEL
ALT: 18 U/L (ref 0–35)
AST: 15 U/L (ref 0–37)
Albumin: 4.4 g/dL (ref 3.5–5.2)
Alkaline Phosphatase: 136 U/L — ABNORMAL HIGH (ref 39–117)
BUN: 8 mg/dL (ref 6–23)
CO2: 28 mEq/L (ref 19–32)
Calcium: 10.1 mg/dL (ref 8.4–10.5)
Chloride: 101 mEq/L (ref 96–112)
Creatinine, Ser: 0.66 mg/dL (ref 0.40–1.20)
GFR: 100.27 mL/min (ref >60.00)
Glucose, Bld: 91 mg/dL (ref 70–99)
Potassium: 4.1 mEq/L (ref 3.5–5.1)
Sodium: 138 mEq/L (ref 135–145)
Total Bilirubin: 0.3 mg/dL (ref 0.2–1.2)
Total Protein: 6.6 g/dL (ref 6.0–8.3)

## 2020-12-31 LAB — LIPID PANEL
Cholesterol: 199 mg/dL (ref 0–200)
HDL: 46.2 mg/dL (ref >39.00)
NonHDL: 152.47
Total CHOL/HDL Ratio: 4
Triglycerides: 207 mg/dL — ABNORMAL HIGH (ref 0.0–149.0)
VLDL: 41.4 mg/dL — ABNORMAL HIGH (ref 0.0–40.0)

## 2020-12-31 LAB — LDL CHOLESTEROL, DIRECT: Direct LDL: 130 mg/dL

## 2020-12-31 LAB — HEMOGLOBIN A1C: Hgb A1c MFr Bld: 6.3 % (ref 4.6–6.5)

## 2020-12-31 NOTE — Telephone Encounter (Signed)
LVM for pt to call clinic, pt needs to schedule and return soon for xray that was to be done today when she had her labs

## 2020-12-31 NOTE — Telephone Encounter (Signed)
Called patient to notify that order has been re-placed.

## 2021-01-04 LAB — HEPATITIS C ANTIBODY
Hepatitis C Ab: NONREACTIVE
SIGNAL TO CUT-OFF: 0 (ref <1.00)

## 2021-01-07 ENCOUNTER — Encounter: Payer: Managed Care, Other (non HMO) | Admitting: Family Medicine

## 2021-01-11 ENCOUNTER — Encounter: Payer: Managed Care, Other (non HMO) | Admitting: Family Medicine

## 2021-01-12 ENCOUNTER — Telehealth: Payer: Self-pay | Admitting: Family Medicine

## 2021-01-12 NOTE — Telephone Encounter (Signed)
Called to discuss billing concern with patient. Lvm asking her to return my call.

## 2021-01-14 ENCOUNTER — Ambulatory Visit (INDEPENDENT_AMBULATORY_CARE_PROVIDER_SITE_OTHER): Payer: Managed Care, Other (non HMO) | Admitting: Family Medicine

## 2021-01-14 ENCOUNTER — Encounter: Payer: Self-pay | Admitting: Family Medicine

## 2021-01-14 ENCOUNTER — Other Ambulatory Visit: Payer: Self-pay

## 2021-01-14 ENCOUNTER — Ambulatory Visit (INDEPENDENT_AMBULATORY_CARE_PROVIDER_SITE_OTHER)
Admission: RE | Admit: 2021-01-14 | Discharge: 2021-01-14 | Disposition: A | Discharge status: Home / Self Care | Payer: Managed Care, Other (non HMO) | Source: Ambulatory Visit | Attending: Family Medicine | Admitting: Family Medicine

## 2021-01-14 VITALS — BP 120/70 | HR 85 | Temp 97.9°F | Ht 67.75 in | Wt 200.1 lb

## 2021-01-14 DIAGNOSIS — Z0000 Encounter for general adult medical examination without abnormal findings: Secondary | ICD-10-CM | POA: Diagnosis not present

## 2021-01-14 DIAGNOSIS — R7303 Prediabetes: Secondary | ICD-10-CM

## 2021-01-14 DIAGNOSIS — F17200 Nicotine dependence, unspecified, uncomplicated: Secondary | ICD-10-CM

## 2021-01-14 DIAGNOSIS — R748 Abnormal levels of other serum enzymes: Secondary | ICD-10-CM

## 2021-01-14 DIAGNOSIS — M7072 Other bursitis of hip, left hip: Secondary | ICD-10-CM | POA: Insufficient documentation

## 2021-01-14 DIAGNOSIS — J189 Pneumonia, unspecified organism: Secondary | ICD-10-CM | POA: Diagnosis not present

## 2021-01-14 DIAGNOSIS — M7062 Trochanteric bursitis, left hip: Secondary | ICD-10-CM

## 2021-01-14 DIAGNOSIS — E669 Obesity, unspecified: Secondary | ICD-10-CM

## 2021-01-14 DIAGNOSIS — E785 Hyperlipidemia, unspecified: Secondary | ICD-10-CM

## 2021-01-14 MED ORDER — PRAVASTATIN SODIUM 10 MG PO TABS: 10.0000 mg | ORAL_TABLET | Freq: Every day | ORAL | 3 refills | Status: DC

## 2021-01-14 NOTE — Assessment & Plan Note (Signed)
CXR 08/2020 with RUL airspace opacity suspicious for CAP - treated with levaquin. Overdue for rpt CXR - will complete today.

## 2021-01-14 NOTE — Assessment & Plan Note (Signed)
Anticipate L trochanteric bursitis based on exam today.  Provided with exercises from SM pt advisor Discussed SM eval if not improving with this.

## 2021-01-14 NOTE — Assessment & Plan Note (Addendum)
Isolated elevation noted today, pt asxs - will continue to monitor. Recheck in 3 months.

## 2021-01-14 NOTE — Assessment & Plan Note (Signed)
1/2 ppd - continue to encourage full smoking cessation.  ~15 PY hx.

## 2021-01-14 NOTE — Patient Instructions (Addendum)
Repeat chest xray today.  I think you have left hip bursitis - do exercises provided today. Consider shingrix vaccine, let us know if interested.  Schedule appointment with GYN - discuss mammogram and colon cancer screening at that time.  Return as needed or in 1 year for next physical.   Health Maintenance, Female Adopting a healthy lifestyle and getting preventive care are important in promoting health and wellness. Ask your health care provider about:  The right schedule for you to have regular tests and exams.  Things you can do on your own to prevent diseases and keep yourself healthy. What should I know about diet, weight, and exercise? Eat a healthy diet  Eat a diet that includes plenty of vegetables, fruits, low-fat dairy products, and lean protein.  Do not eat a lot of foods that are high in solid fats, added sugars, or sodium.   Maintain a healthy weight Body mass index (BMI) is used to identify weight problems. It estimates body fat based on height and weight. Your health care provider can help determine your BMI and help you achieve or maintain a healthy weight. Get regular exercise Get regular exercise. This is one of the most important things you can do for your health. Most adults should:  Exercise for at least 150 minutes each week. The exercise should increase your heart rate and make you sweat (moderate-intensity exercise).  Do strengthening exercises at least twice a week. This is in addition to the moderate-intensity exercise.  Spend less time sitting. Even light physical activity can be beneficial. Watch cholesterol and blood lipids Have your blood tested for lipids and cholesterol at 54 years of age, then have this test every 5 years. Have your cholesterol levels checked more often if:  Your lipid or cholesterol levels are high.  You are older than 54 years of age.  You are at high risk for heart disease. What should I know about cancer screening? Depending  on your health history and family history, you may need to have cancer screening at various ages. This may include screening for:  Breast cancer.  Cervical cancer.  Colorectal cancer.  Skin cancer.  Lung cancer. What should I know about heart disease, diabetes, and high blood pressure? Blood pressure and heart disease  High blood pressure causes heart disease and increases the risk of stroke. This is more likely to develop in people who have high blood pressure readings, are of African descent, or are overweight.  Have your blood pressure checked: ? Every 3-5 years if you are 71-63 years of age. ? Every year if you are 20 years old or older. Diabetes Have regular diabetes screenings. This checks your fasting blood sugar level. Have the screening done:  Once every three years after age 28 if you are at a normal weight and have a low risk for diabetes.  More often and at a younger age if you are overweight or have a high risk for diabetes. What should I know about preventing infection? Hepatitis B If you have a higher risk for hepatitis B, you should be screened for this virus. Talk with your health care provider to find out if you are at risk for hepatitis B infection. Hepatitis C Testing is recommended for:  Everyone born from [birth year] through 1964.  Anyone with known risk factors for hepatitis C. Sexually transmitted infections (STIs)  Get screened for STIs, including gonorrhea and chlamydia, if: ? You are sexually active and are younger than 54 years of  age. ? You are older than 54 years of age and your health care provider tells you that you are at risk for this type of infection. ? Your sexual activity has changed since you were last screened, and you are at increased risk for chlamydia or gonorrhea. Ask your health care provider if you are at risk.  Ask your health care provider about whether you are at high risk for HIV. Your health care provider may recommend a  prescription medicine to help prevent HIV infection. If you choose to take medicine to prevent HIV, you should first get tested for HIV. You should then be tested every 3 months for as long as you are taking the medicine. Pregnancy  If you are about to stop having your period (premenopausal) and you may become pregnant, seek counseling before you get pregnant.  Take 400 to 800 micrograms (mcg) of folic acid every day if you become pregnant.  Ask for birth control (contraception) if you want to prevent pregnancy. Osteoporosis and menopause Osteoporosis is a disease in which the bones lose minerals and strength with aging. This can result in bone fractures. If you are 70 years old or older, or if you are at risk for osteoporosis and fractures, ask your health care provider if you should:  Be screened for bone loss.  Take a calcium or vitamin D supplement to lower your risk of fractures.  Be given hormone replacement therapy (HRT) to treat symptoms of menopause. Follow these instructions at home: Lifestyle  Do not use any products that contain nicotine or tobacco, such as cigarettes, e-cigarettes, and chewing tobacco. If you need help quitting, ask your health care provider.  Do not use street drugs.  Do not share needles.  Ask your health care provider for help if you need support or information about quitting drugs. Alcohol use  Do not drink alcohol if: ? Your health care provider tells you not to drink. ? You are pregnant, may be pregnant, or are planning to become pregnant.  If you drink alcohol: ? Limit how much you use to 0-1 drink a day. ? Limit intake if you are breastfeeding.  Be aware of how much alcohol is in your drink. In the [place] one drink equals one 12 oz bottle of beer (355 mL), one 5 oz glass of wine (148 mL), or one 1 oz glass of hard liquor (44 mL). General instructions  Schedule regular health, dental, and eye exams.  Stay current with your  vaccines.  Tell your health care provider if: ? You often feel depressed. ? You have ever been abused or do not feel safe at home. Summary  Adopting a healthy lifestyle and getting preventive care are important in promoting health and wellness.  Follow your health care provider's instructions about healthy diet, exercising, and getting tested or screened for diseases.  Follow your health care provider's instructions on monitoring your cholesterol and blood pressure. This information is not intended to replace advice given to you by your health care provider. Make sure you discuss any questions you have with your health care provider. Document Revised: 11/27/2018 Document Reviewed: 11/27/2018 Elsevier Patient Education  2021 [employer].

## 2021-01-14 NOTE — Assessment & Plan Note (Signed)
Chronic, overall stable readings on pravastatin. Reviewed diet choices to improve triglyceride control. The 10-year ASCVD risk score Mikey Bussing DC Brooke Bonito., et al., 2013) is: 4.5%   Values used to calculate the score:     Age: 55 years     Sex: Female     Is Non-Hispanic African American: No     Diabetic: No     Tobacco smoker: Yes     Systolic Blood Pressure: 863 mmHg     Is BP treated: No     HDL Cholesterol: 46.2 mg/dL     Total Cholesterol: 199 mg/dL

## 2021-01-14 NOTE — Assessment & Plan Note (Signed)
Encouraged healthy diet and lifestyle choices to affect sustainable weight loss.  ?

## 2021-01-14 NOTE — Progress Notes (Signed)
Patient ID: Barbara Riley, female    DOB: 12/11/1966, 55 y.o.   MRN: [id number]  This visit was conducted in person.  BP 120/70 (BP Location: Left Arm, Patient Position: Sitting, Cuff Size: Large)   Pulse 85   Temp 97.9 F (36.6 C) (Temporal)   Ht 5' 7.75" (1.721 m)   Wt 200 lb 2 oz (90.8 kg)   SpO2 98%   BMI 30.65 kg/m    CC: CPE Subjective:   HPI: [name] is a 55 y.o. female presenting on 01/14/2021 for Annual Exam   Seen 08/2020 for CAP PNA treated with levaquin and albuterol inhaler PRN. Had trouble tolerating levaquin but she completed course. Didn't get COVID tested at that time but subsequently tested negative.  Known CTS.  Notes itchy feet and legs when she gets home. This is despite moisturizer.  Notes some left leg pain at lateral hip for several months.   Preventative: Colon cancer screening - wants to discuss with GYN  [facility] withDr. Matthew Saras O[facility] 2021 - s/p D&C and ablation for heavy bleeding after stopped birth control. Prior on depo shots. Hot flashes present for years.  LMP - unsure ~08/2019  Mammogram with GYN 05/2019, due. Will f/u with GYN Lung cancer screening - not eligible Flu shot - declines COVID vaccine - [place] 07/2020, 08/2020  Tdap 2013 Shingrix - discussed  Seat belt use discussed  Sunscreen use discussed. No changing moles. Smoking - 1/2 ppd, started age 58 yo.  Alcohol - none  Dentist - due, not since [place] exam yearly   Caffeine: 1-2 cups tea/day Married, lives with husband and son  Occupation: [employer] employee Activity:no regular exercise  Diet: some water, fruits/vegetables daily, brings lunch from home, no fish     Relevant past medical, surgical, family and social history reviewed and updated as indicated. Interim medical history since our last visit reviewed. Allergies and medications reviewed and updated. Outpatient Medications Prior to Visit  Medication Sig Dispense Refill  . calcium citrate-vitamin D  (CITRACAL+D) 315-200 MG-UNIT tablet Take 1 tablet by mouth daily.    . Cholecalciferol (VITAMIN D3) 25 MCG (1000 UT) CAPS Take by mouth daily.    . diphenhydrAMINE (BENADRYL) 25 mg capsule Take 1 capsule (25 mg total) by mouth every 6 (six) hours as needed.    Marland Kitchen escitalopram (LEXAPRO) 10 MG tablet Take 1 tablet by mouth daily.  0  . Multiple Vitamin (MULTIVITAMIN) tablet Take 1 tablet by mouth daily.    Marland Kitchen albuterol (VENTOLIN HFA) 108 (90 Base) MCG/ACT inhaler INHALE 1 TO 2 PUFFS INTO THE LUNGS EVERY 6 HOURS AS NEEDED FOR WHEEZING OR SHORTNESS OF BREATH 25.5 g 0  . pravastatin (PRAVACHOL) 10 MG tablet TAKE 1 TABLET(10 MG) BY MOUTH DAILY 90 tablet 0  . levofloxacin (LEVAQUIN) 750 MG tablet Take 1 tablet (750 mg total) by mouth daily. 7 tablet 0   No facility-administered medications prior to visit.     Per HPI unless specifically indicated in ROS section below Review of Systems  Constitutional: Negative for activity change, appetite change, chills, fatigue, fever and unexpected weight change.  HENT: Negative for hearing loss.   Eyes: Negative for visual disturbance.  Respiratory: Negative for cough, chest tightness, shortness of breath and wheezing.   Cardiovascular: Negative for chest pain, palpitations and leg swelling.  Gastrointestinal: Negative for abdominal distention, abdominal pain, blood in stool, constipation, diarrhea, nausea and vomiting.  Genitourinary: Negative for difficulty urinating and hematuria.  Musculoskeletal: Negative  for arthralgias, myalgias and neck pain.  Skin: Negative for rash.  Neurological: Negative for dizziness, seizures, syncope and headaches.  Hematological: Negative for adenopathy. Does not bruise/bleed easily.  Psychiatric/Behavioral: Negative for dysphoric mood. The patient is not nervous/anxious.    Objective:  BP 120/70 (BP Location: Left Arm, Patient Position: Sitting, Cuff Size: Large)   Pulse 85   Temp 97.9 F (36.6 C) (Temporal)   Ht 5' 7.75"  (1.721 m)   Wt 200 lb 2 oz (90.8 kg)   SpO2 98%   BMI 30.65 kg/m   Wt Readings from Last 3 Encounters:  01/14/21 200 lb 2 oz (90.8 kg)  08/30/20 200 lb (90.7 kg)  08/29/20 200 lb (90.7 kg)      Physical Exam Vitals and nursing note reviewed.  Constitutional:      General: She is not in acute distress.    Appearance: Normal appearance. She is well-developed and well-nourished. She is not ill-appearing.  HENT:     Head: Normocephalic and atraumatic.     Right Ear: Hearing, tympanic membrane, ear canal and external ear normal.     Left Ear: Hearing, tympanic membrane, ear canal and external ear normal.     Mouth/Throat:     Mouth: Oropharynx is clear and moist and mucous membranes are normal.     Pharynx: Uvula midline. No posterior oropharyngeal edema.  Eyes:     General: No scleral icterus.    Extraocular Movements: Extraocular movements intact and EOM normal.     Conjunctiva/sclera: Conjunctivae normal.     Pupils: Pupils are equal, round, and reactive to light.  Neck:     Thyroid: No thyroid mass or thyromegaly.  Cardiovascular:     Rate and Rhythm: Normal rate and regular rhythm.     Pulses: Normal pulses and intact distal pulses.          Radial pulses are 2+ on the right side and 2+ on the left side.     Heart sounds: Normal heart sounds. No murmur heard.   Pulmonary:     Effort: Pulmonary effort is normal. No respiratory distress.     Breath sounds: Normal breath sounds. No wheezing, rhonchi or rales.  Abdominal:     General: Abdomen is flat. Bowel sounds are normal. There is no distension.     Palpations: Abdomen is soft. There is no mass.     Tenderness: There is no abdominal tenderness. There is no guarding or rebound.     Hernia: No hernia is present.  Musculoskeletal:        General: No edema. Normal range of motion.     Cervical back: Normal range of motion and neck supple.     Right lower leg: No edema.     Left lower leg: No edema.     Comments:  Neg  SLR bilaterally. No pain with int/ext rotation at hip. No pain at SIJ, or significant pain at sciatic notch bilaterally.  Reproducible pain to palpation of left trochanteric bursa  Lymphadenopathy:     Cervical: No cervical adenopathy.  Skin:    General: Skin is warm and dry.     Findings: No rash.  Neurological:     General: No focal deficit present.     Mental Status: She is alert and oriented to person, place, and time.     Comments: CN grossly intact, station and gait intact  Psychiatric:        Mood and Affect: Mood and affect and mood  normal.        Behavior: Behavior normal.        Thought Content: Thought content normal.        Judgment: Judgment normal.       Results for orders placed or performed in visit on 12/31/20  Comprehensive metabolic panel  Result Value Ref Range   Sodium 138 135 - 145 mEq/L   Potassium 4.1 3.5 - 5.1 mEq/L   Chloride 101 96 - 112 mEq/L   CO2 28 19 - 32 mEq/L   Glucose, Bld 91 70 - 99 mg/dL   BUN 8 6 - 23 mg/dL   Creatinine, Ser 0.66 0.40 - 1.20 mg/dL   Total Bilirubin 0.3 0.2 - 1.2 mg/dL   Alkaline Phosphatase 136 (H) 39 - 117 U/L   AST 15 0 - 37 U/L   ALT 18 0 - 35 U/L   Total Protein 6.6 6.0 - 8.3 g/dL   Albumin 4.4 3.5 - 5.2 g/dL   GFR 100.27 >60.00 mL/min   Calcium 10.1 8.4 - 10.5 mg/dL  Hepatitis C antibody  Result Value Ref Range   Hepatitis C Ab NON-REACTIVE NON-REACTI   SIGNAL TO CUT-OFF 0.00 <1.00  Hemoglobin A1c  Result Value Ref Range   Hgb A1c MFr Bld 6.3 4.6 - 6.5 %  Lipid panel  Result Value Ref Range   Cholesterol 199 0 - 200 mg/dL   Triglycerides 207.0 (H) 0.0 - 149.0 mg/dL   HDL 46.20 >39.00 mg/dL   VLDL 41.4 (H) 0.0 - 40.0 mg/dL   Total CHOL/HDL Ratio 4    NonHDL 152.47   LDL cholesterol, direct  Result Value Ref Range   Direct LDL 130.0 mg/dL   DG Chest 2 View CLINICAL DATA:  COVID exposure, fatigue, nasal congestion, short of breath  EXAM: CHEST - 2 VIEW  COMPARISON:  None.  FINDINGS: Subtle  patchy airspace opacity in the right upper lung. Mild bronchitic changes bilaterally. No pleural effusion or pneumothorax. Cardiac and mediastinal contours are within normal limits. No acute fracture or lytic or blastic osseous lesions. The visualized upper abdominal bowel gas pattern is unremarkable.  IMPRESSION: Subtle patchy airspace opacity in the right upper lung may represent early bronchopneumonia. Recommend follow-up chest x-ray in 4-6 weeks after an appropriate course of therapy to ensure resolution and exclude the possibility of underlying pulmonary nodule/neoplasm.  Mild bilateral bronchitic changes are favored to be chronic in nature.  Electronically Signed   By: Jacqulynn Cadet M.D.   On: 08/29/2020 14:36   Assessment & Plan:  This visit occurred during the SARS-CoV-2 public health emergency.  Safety protocols were in place, including screening questions prior to the visit, additional usage of staff PPE, and extensive cleaning of exam room while observing appropriate contact time as indicated for disinfecting solutions.   Problem List Items Addressed This Visit    Smoker    1/2 ppd - continue to encourage full smoking cessation.  ~15 PY hx.       Prediabetes    rec avoid added sugars and sweetened beverages in the diet.       Obesity, Class I, BMI 30-34.9    Encouraged healthy diet and lifestyle choices to affect sustainable weight loss.       Healthcare maintenance - Primary    Preventative protocols reviewed and updated unless pt declined. Discussed healthy diet and lifestyle.       Dyslipidemia    Chronic, overall stable readings on pravastatin. Reviewed diet choices to improve  triglyceride control. The 10-year ASCVD risk score Mikey Bussing DC Brooke Bonito., et al., 2013) is: 4.5%   Values used to calculate the score:     Age: 42 years     Sex: Female     Is Non-Hispanic African American: No     Diabetic: No     Tobacco smoker: Yes     Systolic Blood Pressure: 123456  mmHg     Is BP treated: No     HDL Cholesterol: 46.2 mg/dL     Total Cholesterol: 199 mg/dL       Relevant Medications   pravastatin (PRAVACHOL) 10 MG tablet   CAP (community acquired pneumonia)    CXR 08/2020 with RUL airspace opacity suspicious for CAP - treated with levaquin. Overdue for rpt CXR - will complete today.       Bursitis of left hip    Anticipate L trochanteric bursitis based on exam today.  Provided with exercises from SM pt advisor Discussed SM eval if not improving with this.       Alkaline phosphatase elevation    Isolated elevation noted today, pt asxs - will continue to monitor. Recheck in 3 months.       Relevant Orders   Hepatic function panel       Meds ordered this encounter  Medications  . pravastatin (PRAVACHOL) 10 MG tablet    Sig: Take 1 tablet (10 mg total) by mouth daily.    Dispense:  90 tablet    Refill:  3   Orders Placed This Encounter  Procedures  . Hepatic function panel    Standing Status:   Future    Standing Expiration Date:   01/14/2022    Patient instructions: Repeat chest xray today.  I think you have left hip bursitis - do exercises provided today. Consider shingrix vaccine, let us know if interested.  Schedule appointment with GYN - discuss mammogram and colon cancer screening at that time.  Return as needed or in 1 year for next physical.   Follow up plan: Return in about 1 year (around 01/14/2022), or if symptoms worsen or fail to improve, for annual exam, prior fasting for blood work.  [name], MD

## 2021-01-14 NOTE — Telephone Encounter (Signed)
plz call - i'd like her to recheck liver function in 3 months to ensure alk phos elevation has resolved. plz schedule.

## 2021-01-14 NOTE — Assessment & Plan Note (Signed)
Preventative protocols reviewed and updated unless pt declined. Discussed healthy diet and lifestyle.  

## 2021-01-14 NOTE — Telephone Encounter (Signed)
Lvm asking pt to call back.  Need to schedule 3 mo lab visit. (see Dr. Synthia Innocent message below)

## 2021-01-14 NOTE — Assessment & Plan Note (Signed)
rec avoid added sugars and sweetened beverages in the diet.

## 2021-01-17 NOTE — Telephone Encounter (Signed)
Lvm returning pt's call.

## 2021-01-17 NOTE — Telephone Encounter (Signed)
Patient is requesting a call back to discuss something with you. I have scheduled her a lab appt. EM

## 2021-01-17 NOTE — Telephone Encounter (Signed)
Lvm asking pt to call back.  Need to schedule 3 mo lab visit. (see Dr. G's message below) 

## 2021-01-18 NOTE — Telephone Encounter (Signed)
Lvm returning pt's call.

## 2021-01-19 NOTE — Telephone Encounter (Signed)
Lvm returning pt's call.  Closing encounter.

## 2021-01-20 ENCOUNTER — Telehealth: Payer: Self-pay

## 2021-01-20 NOTE — Telephone Encounter (Signed)
Left message for patient to call back for results or can see results Dr. Damita Dunnings posted in [place].

## 2021-01-20 NOTE — Telephone Encounter (Signed)
Pt left v/m requesting cb with CXR results on 01/14/21.

## 2021-01-21 ENCOUNTER — Ambulatory Visit (INDEPENDENT_AMBULATORY_CARE_PROVIDER_SITE_OTHER): Payer: Managed Care, Other (non HMO)

## 2021-01-21 DIAGNOSIS — Z23 Encounter for immunization: Secondary | ICD-10-CM

## 2021-01-21 NOTE — Progress Notes (Signed)
Per orders of Dr. Danise Mina, injection of shingrix given by Loreen Freud. Patient tolerated injection well.

## 2021-01-21 NOTE — Telephone Encounter (Signed)
Called patient and left message to call back if she still needs results. Patient was able to log on and see her results via mychart.

## 2021-01-26 ENCOUNTER — Ambulatory Visit: Payer: Managed Care, Other (non HMO)

## 2021-02-15 ENCOUNTER — Other Ambulatory Visit: Payer: Self-pay

## 2021-02-15 ENCOUNTER — Telehealth: Payer: Self-pay

## 2021-02-15 ENCOUNTER — Encounter: Payer: Self-pay | Admitting: Family Medicine

## 2021-02-15 ENCOUNTER — Ambulatory Visit: Payer: Managed Care, Other (non HMO) | Admitting: Family Medicine

## 2021-02-15 VITALS — BP 120/70 | HR 72 | Temp 97.4°F | Wt 198.5 lb

## 2021-02-15 DIAGNOSIS — R748 Abnormal levels of other serum enzymes: Secondary | ICD-10-CM | POA: Diagnosis not present

## 2021-02-15 DIAGNOSIS — R6889 Other general symptoms and signs: Secondary | ICD-10-CM | POA: Insufficient documentation

## 2021-02-15 NOTE — Assessment & Plan Note (Signed)
Mild elevation and in setting of warm sensation will get fractionated and repeat LFTs.

## 2021-02-15 NOTE — Telephone Encounter (Signed)
Noted see note from today

## 2021-02-15 NOTE — Telephone Encounter (Signed)
[name] - Client TELEPHONE ADVICE RECORD [facility] Patient Name: Barbara Riley Gender: Female DOB: 02/15/1966 Age: 55 Y 81 M 14 D Return Phone Number: [phone] (Primary), [phone] (Secondary) Address[place] [address] Client [facility] - Client Client Site [name] Physician [name] - MD Contact Type Call Who Is Calling Patient / Member / Family / Caregiver Call Type Triage / Clinical Relationship To Patient Self Return Phone Number [phone] (Secondary) Chief Complaint Unclassified Symptom Reason for Call Symptomatic / Request for [place] states, pt got her shingles shot and having a reaction. Pt has fever in her back. Translation No Nurse Assessment Nurse: [name], RN, Levada Dy Date/Time (Eastern Time): 02/15/2021 9:16:11 AM Confirm and document reason for call. If symptomatic, describe symptoms. ---Caller states she had a Shingrix vaccine back on 01/21/21 and she feels "heat in her back" that started after the vaccine. There is no rash or bruising or anything. Does the patient have any new or worsening symptoms? ---Yes Will a triage be completed? ---Yes Related visit to physician within the last 2 weeks? ---No Does the PT have any chronic conditions? (i.e. diabetes, asthma, this includes High risk factors for pregnancy, etc.) ---No Is the patient pregnant or possibly pregnant? (Ask all females between the ages of 36-55) ---No Is this a behavioral health or substance abuse call? ---No Guidelines Guideline Title Affirmed Question Affirmed Notes Nurse Date/Time (Eastern Time) Immunization Reactions Shingles (Herpes zoster; Shingrix) vaccine reactions [name], RN, Levada Dy 02/15/2021 9:18:17 AM Disp. Time Eilene Ghazi Time) Disposition Final User 02/15/2021 9:21:03 AM Home Care Yes [name], RN, [place] Shutter Disagree/Comply  Comply Caller Understands Yes PLEASE NOTE: All timestamps contained within this report are represented as [place] Standard Time. CONFIDENTIALTY NOTICE: This fax transmission is intended only for the addressee. It contains information that is legally privileged, confidential or otherwise protected from use or disclosure. If you are not the intended recipient, you are strictly prohibited from reviewing, disclosing, copying using or disseminating any of this information or taking any action in reliance on or regarding this information. If you have received this fax in error, please notify us immediately by telephone so that we can arrange for its return to [place]. Phone: [phone], Toll-Free: [phone], Fax: [phone] Page: 2 of 2 Call Id: [id number] PreDisposition Did not know what to do Care Advice Given Per Guideline HOME CARE: * You should be able to treat this at home. NOTE TO TRIAGER - DISCUSSING COMMON VS RARE REACTIONS: * Discuss the COMMON REACTIONS with the caller. Reassure the caller that these reactions are generally harmless. SHINGLES (RECOMBINANT ZOSTER VACCINE; RZV; SHINGRIX) VACCINE - COMMON REACTIONS: * Pain, redness, or swelling at the injection site (80%). * Headache, muscle aches, fatigue. * Stomach pain or nausea. * Fever or chills. CARE ADVICE given per Immunization Reactions (Adult) guideline. CALL BACK IF: * You become worse * Fever lasts over 3 days

## 2021-02-15 NOTE — Telephone Encounter (Signed)
Per appt notes pt has already had visit with Dr Einar Pheasant today. Sending note to Dr Einar Pheasant and Dr Darnell Level as PCP.

## 2021-02-15 NOTE — Assessment & Plan Note (Signed)
Etiology unclear. Correlation with shingles vaccine - however unable to find this as a common side effect. She is on a 2 week course of prednisone but not a clear side effect. No sweating but did acknowledge that Lexapro could cause hot flashes? Though she has been stable for 15 years w/o symptoms. Will check thyroid and liver studies. Discussed monitoring symptoms off steroids as well. If persisting and no clear cause advised considering changing SSRI.

## 2021-02-15 NOTE — Patient Instructions (Signed)
Blood work today to recheck liver function and thyroid  Do not get the second shingles shot until we know what is causing your symptoms

## 2021-02-15 NOTE — Progress Notes (Signed)
Subjective:     Barbara Riley is a 55 y.o. female presenting for Fever (Feels hot and feverish in her back and shoulders since 01/21/21 (shingrix vaccine))     HPI  #Heat sensation - along the back - some left arm stiffness - started along the spine and has moved up to the shoulders - started 1 week after getting her shingles shot (shot was 2/4) and symptoms started 2/7 - took tylenol today - she said when other people touch the skin it feels warm - no itching - feels Hot inside  Taking prednisone - for 2 weeks Lexapro for years  Review of Systems  Constitutional: Negative for chills, diaphoresis and fever.  Endocrine: Positive for heat intolerance.  Musculoskeletal: Negative for arthralgias, back pain and myalgias.     Social History   Tobacco Use  Smoking Status Former Smoker  . Packs/day: 0.25  . Types: Cigarettes  Smokeless Tobacco Never Used  Tobacco Comment   4-5 cigarettes daily        Objective:    BP Readings from Last 3 Encounters:  02/15/21 120/70  01/14/21 120/70  08/30/20 139/87   Wt Readings from Last 3 Encounters:  02/15/21 198 lb 8 oz (90 kg)  01/14/21 200 lb 2 oz (90.8 kg)  08/30/20 200 lb (90.7 kg)    BP 120/70   Pulse 72   Temp (!) 97.4 F (36.3 C) (Temporal)   Wt 198 lb 8 oz (90 kg)   SpO2 98%   BMI 30.40 kg/m    Physical Exam Constitutional:      General: She is not in acute distress.    Appearance: She is well-developed. She is not diaphoretic.  HENT:     Right Ear: External ear normal.     Left Ear: External ear normal.     Nose: Nose normal.  Eyes:     Conjunctiva/sclera: Conjunctivae normal.  Cardiovascular:     Rate and Rhythm: Normal rate and regular rhythm.  Pulmonary:     Effort: Pulmonary effort is normal.     Breath sounds: Normal breath sounds.  Musculoskeletal:     Cervical back: Neck supple.  Skin:    General: Skin is warm and dry.     Capillary Refill: Capillary refill takes less than 2 seconds.      Comments: No rashes or erythema on the back. No warmth. No ttp  Neurological:     Mental Status: She is alert. Mental status is at baseline.  Psychiatric:        Mood and Affect: Mood normal.        Behavior: Behavior normal.           Assessment & Plan:   Problem List Items Addressed This Visit      Other   Elevated alkaline phosphatase level    Mild elevation and in setting of warm sensation will get fractionated and repeat LFTs.       Relevant Orders   Hepatic function panel   TSH   Alkaline Phosphatase, Isoenzymes   Heat intolerance - Primary    Etiology unclear. Correlation with shingles vaccine - however unable to find this as a common side effect. She is on a 2 week course of prednisone but not a clear side effect. No sweating but did acknowledge that Lexapro could cause hot flashes? Though she has been stable for 15 years w/o symptoms. Will check thyroid and liver studies. Discussed monitoring symptoms off steroids as well. If  persisting and no clear cause advised considering changing SSRI.       Relevant Orders   Hepatic function panel   TSH   Alkaline Phosphatase, Isoenzymes       Return if symptoms worsen or fail to improve.  Lesleigh Noe, MD  This visit occurred during the SARS-CoV-2 public health emergency.  Safety protocols were in place, including screening questions prior to the visit, additional usage of staff PPE, and extensive cleaning of exam room while observing appropriate contact time as indicated for disinfecting solutions.

## 2021-02-18 LAB — HEPATIC FUNCTION PANEL
ALT: 21 IU/L (ref 0–32)
AST: 13 IU/L (ref 0–40)
Albumin: 4.3 g/dL (ref 3.8–4.9)
Alkaline Phosphatase: 143 IU/L — ABNORMAL HIGH (ref 44–121)
Bilirubin Total: <0.2 mg/dL (ref 0.0–1.2)
Bilirubin, Direct: <0.1 mg/dL (ref 0.00–0.40)
Total Protein: 6.7 g/dL (ref 6.0–8.5)

## 2021-02-18 LAB — TSH: TSH: 0.699 u[IU]/mL (ref 0.450–4.500)

## 2021-02-18 LAB — ALKALINE PHOSPHATASE, ISOENZYMES
BONE FRACTION: 28 % (ref 14–68)
INTESTINAL FRAC.: 0 % (ref 0–18)
LIVER FRACTION: 72 % (ref 18–85)

## 2021-03-30 ENCOUNTER — Other Ambulatory Visit: Payer: Self-pay

## 2021-03-30 ENCOUNTER — Encounter: Payer: Self-pay | Admitting: Family Medicine

## 2021-03-30 ENCOUNTER — Ambulatory Visit (INDEPENDENT_AMBULATORY_CARE_PROVIDER_SITE_OTHER): Payer: Managed Care, Other (non HMO) | Admitting: Family Medicine

## 2021-03-30 VITALS — BP 130/70 | HR 85 | Temp 97.9°F | Ht 67.75 in | Wt 199.2 lb

## 2021-03-30 DIAGNOSIS — R0789 Other chest pain: Secondary | ICD-10-CM | POA: Diagnosis not present

## 2021-03-30 DIAGNOSIS — I4510 Unspecified right bundle-branch block: Secondary | ICD-10-CM

## 2021-03-30 DIAGNOSIS — R209 Unspecified disturbances of skin sensation: Secondary | ICD-10-CM

## 2021-03-30 DIAGNOSIS — M542 Cervicalgia: Secondary | ICD-10-CM | POA: Diagnosis not present

## 2021-03-30 DIAGNOSIS — H9211 Otorrhea, right ear: Secondary | ICD-10-CM

## 2021-03-30 MED ORDER — METHOCARBAMOL 500 MG PO TABS: 500.0000 mg | ORAL_TABLET | Freq: Three times a day (TID) | ORAL | 0 refills | Status: DC | PRN

## 2021-03-30 MED ORDER — AZITHROMYCIN 250 MG PO TABS: ORAL_TABLET | ORAL | 0 refills | Status: DC

## 2021-03-30 MED ORDER — GABAPENTIN 100 MG PO CAPS: ORAL_CAPSULE | ORAL | 1 refills | Status: DC

## 2021-03-30 NOTE — Progress Notes (Signed)
Patient ID: Barbara Riley, female    DOB: 1966-07-06, 55 y.o.   MRN: [id number]  This visit was conducted in person.  BP 130/70   Pulse 85   Temp 97.9 F (36.6 C) (Temporal)   Ht 5' 7.75" (1.721 m)   Wt 199 lb 4 oz (90.4 kg)   SpO2 97%   BMI 30.52 kg/m    CC: skin changes after shingrix vaccine, R ear drainage, neck pain  Subjective:   HPI: Barbara Riley is a 55 y.o. female presenting on 03/30/2021 for Skin Problem (C/o heat sensation starting in right upper back radiating down side.  Also, c/o neck pain and left shoulder.  Heat feeling started after Shingrix vaccine on 01/21/21.  Other sxs started a couple of wks ago. ) and Ear Drainage (C/o right ear drainage.  Started  03/26/21.  Has appt with ENT 04/20/21.)   4d h/o R ear drainage initially associated with earache. She has been treating with ear wax removal OTC drops. Notes decreased hearing. No fevers/chills, congestion. She has ENT appt 04/20/2021. 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 Denies recent trauma/injury or fall  First shingrix vaccine 01/21/2021. Done at R upper arm. Since then, feels increased skin sensitivity and heat sensation to R lateral side from below R breast to R back. Also notes L leg spasm. Never fevers, chills, hives, skin rash, itching. No muscle weakness or numbness. No dyspnea.  Saw Dr Einar Pheasant last month for this concern - TSH, LFTs normal except for elevated ALP with normal fractionated levels. To consider changing SSRI.   Neck and L>R shoulder pain started mid March - describes tightness with radiation down L>R shoulders.   She has also seen ortho for these concerns, h/o known spinal stenosis L4/5 by MRI. Prednisone course didn't help.  Increased stress - brother recently had stents placed, father also with h/o CVA, CAD.  Husband lost job yesterday.   Notes she's not felt well since 08/2020 after PNA after COVID vaccines - wants to avoid all vaccines for the time being.      Relevant past medical,  surgical, family and social history reviewed and updated as indicated. Interim medical history since our last visit reviewed. Allergies and medications reviewed and updated. Outpatient Medications Prior to Visit  Medication Sig Dispense Refill  . calcium citrate-vitamin D (CITRACAL+D) 315-200 MG-UNIT tablet Take 1 tablet by mouth daily.    . Cholecalciferol (VITAMIN D3) 25 MCG (1000 UT) CAPS Take by mouth daily.    . diphenhydrAMINE (BENADRYL) 25 mg capsule Take 1 capsule (25 mg total) by mouth every 6 (six) hours as needed.    Marland Riley escitalopram (LEXAPRO) 5 MG tablet escitalopram 5 mg tablet  Take 1 tablet every day by oral route.    . Multiple Vitamin (MULTIVITAMIN) tablet Take 1 tablet by mouth daily.    . pravastatin (PRAVACHOL) 10 MG tablet Take 1 tablet (10 mg total) by mouth daily. 90 tablet 3  . escitalopram (LEXAPRO) 10 MG tablet Take 1 tablet by mouth daily.  0  . predniSONE (DELTASONE) 10 MG tablet prednisone 10 mg tablet  TAKE 1 TABLET BY MOUTH THREE TIMES DAILY FOR 2 DAYS THEN TAKE 1 TABLET TWICE DAILY FOR 5 DAYS THEN TAKE 1 TABLET DAILY UNTIL ALL TAKEN     No facility-administered medications prior to visit.     Per HPI unless specifically indicated in ROS section below Review of Systems Objective:  BP 130/70   Pulse 85  Temp 97.9 F (36.6 C) (Temporal)   Ht 5' 7.75" (1.721 m)   Wt 199 lb 4 oz (90.4 kg)   SpO2 97%   BMI 30.52 kg/m   Wt Readings from Last 3 Encounters:  03/30/21 199 lb 4 oz (90.4 kg)  02/15/21 198 lb 8 oz (90 kg)  01/14/21 200 lb 2 oz (90.8 kg)      Physical Exam Vitals and nursing note reviewed.  Constitutional:      Appearance: Normal appearance. She is obese. She is not ill-appearing.  HENT:     Right Ear: External ear normal. Decreased hearing noted. Drainage present. A middle ear effusion is present. Tympanic membrane has decreased mobility.     Left Ear: Hearing, tympanic membrane, ear canal and external ear normal.     Ears:     Comments:  Possible R TM perforation with thick fluid behind TM without erythema  Neck:     Comments: FROM cervical neck with marked paracervical mm tightness/tenderness to palaption Cardiovascular:     Rate and Rhythm: Normal rate and regular rhythm.     Pulses: Normal pulses.     Heart sounds: Normal heart sounds. No murmur heard.   Pulmonary:     Effort: Pulmonary effort is normal. No respiratory distress.     Breath sounds: Normal breath sounds. No wheezing, rhonchi or rales.  Musculoskeletal:        General: Normal range of motion.     Cervical back: Normal range of motion and neck supple. No rigidity.     Right lower leg: No edema.     Left lower leg: No edema.  Lymphadenopathy:     Cervical: No cervical adenopathy.  Skin:    General: Skin is warm and dry.     Findings: No erythema, lesion or rash.     Comments: Warmth to R flank area more noticeable compared to left flank   Neurological:     Mental Status: She is alert.  Psychiatric:        Mood and Affect: Mood normal.        Behavior: Behavior normal.       Results for orders placed or performed in visit on 02/15/21  Hepatic function panel  Result Value Ref Range   Total Protein 6.7 6.0 - 8.5 g/dL   Albumin 4.3 3.8 - 4.9 g/dL   Bilirubin Total <0.2 0.0 - 1.2 mg/dL   Bilirubin, Direct <0.10 0.00 - 0.40 mg/dL   Alkaline Phosphatase 143 (H) 44 - 121 IU/L   AST 13 0 - 40 IU/L   ALT 21 0 - 32 IU/L  TSH  Result Value Ref Range   TSH 0.699 0.450 - 4.500 uIU/mL  Alkaline Phosphatase, Isoenzymes  Result Value Ref Range   LIVER FRACTION 72 18 - 85 %   BONE FRACTION 28 14 - 68 %   INTESTINAL FRAC. 0 0 - 18 %   EKG - NSR rate 75, normal axis, intervals, no hypertrophy or acute ST/T changes, partial RBBB with rSR V1/V2, no old to compare  Assessment & Plan:  This visit occurred during the SARS-CoV-2 public health emergency.  Safety protocols were in place, including screening questions prior to the visit, additional usage of  staff PPE, and extensive cleaning of exam room while observing appropriate contact time as indicated for disinfecting solutions.   Problem List Items Addressed This Visit    Neck pain    1+ mo h/o posterior neck and L>R shoulder pain  anticipate related to neck strain/spasm - rec conservative management with heating pad, gentle stretching (handout provided), and Rx robaxin muscle relaxant.  For some upper chest tightness endorsed - check EKG - anticipate non-cardiac      Drainage from right ear    R ear drainage associated with hearing loss in h/o previous injury to that TM. Exam today suspicious for re-perforation of R TM with ongoing thick white drainage from effusion - Rx abx course, keep ENT f/u planned for early next month.       Disturbance of skin sensation - Primary    Notes 2 months of increased skin sensitivity and heat sensation to R side that started after shingrix vaccine 01/21/2021. No improvement with prednisone course she completed through ortho for separate issue.  Temporal association to shingrix vaccine suggests this as possible etiology. She also notes significant increased family stressors recently - ?contribution.  Not consistent with allergic reaction or GBS.  Have recommended not get second shingrix vaccine, and given burning sensation will trial gabapentin course - discussed slow taper as well as side effects to monitor for. Update with effect.       Incomplete RBBB    Incidental finding. Will monitor.        Other Visit Diagnoses    Chest tightness       Relevant Orders   EKG 12-Lead (Completed)       Meds ordered this encounter  Medications  . azithromycin (ZITHROMAX) 250 MG tablet    Sig: Take two tablets on day one followed by one tablet on days 2-5    Dispense:  6 each    Refill:  0  . gabapentin (NEURONTIN) 100 MG capsule    Sig: Take 1 capsule (100 mg total) by mouth at bedtime for 2 days, THEN 1 capsule (100 mg total) 2 (two) times daily for 2 days,  THEN 2 capsules (200 mg total) 2 (two) times daily.    Dispense:  90 capsule    Refill:  1  . methocarbamol (ROBAXIN) 500 MG tablet    Sig: Take 1 tablet (500 mg total) by mouth 3 (three) times daily as needed for muscle spasms (neck pain).    Dispense:  40 tablet    Refill:  0   Orders Placed This Encounter  Procedures  . EKG 12-Lead    Patient Instructions  Possible retear of R ear drum - take zpack antibiotic course, keep ENT appointment.  EKG today  Neck pain - likely musculoskeletal ie neck muscle tightness treat with muscle relaxant, heating pad, gentle stretches. Try nerve pain for skin sensation changes which may be coming as side effect to shingrix vaccine. Take gabapentin 100mg  nightly for 2 days, may increase to twice daily and up to 2 tablets twice daily - update me with effect. Side effects to watch - sedation, dizziness.  Update me with effect of above.    Follow up plan: Return if symptoms worsen or fail to improve.  [name], MD

## 2021-03-30 NOTE — Patient Instructions (Addendum)
Possible retear of R ear drum - take zpack antibiotic course, keep ENT appointment.  EKG today  Neck pain - likely musculoskeletal ie neck muscle tightness treat with muscle relaxant, heating pad, gentle stretches. Try nerve pain for skin sensation changes which may be coming as side effect to shingrix vaccine. Take gabapentin 100mg  nightly for 2 days, may increase to twice daily and up to 2 tablets twice daily - update me with effect. Side effects to watch - sedation, dizziness.  Update me with effect of above.

## 2021-03-31 ENCOUNTER — Ambulatory Visit: Payer: Managed Care, Other (non HMO) | Admitting: Family Medicine

## 2021-04-02 ENCOUNTER — Encounter: Payer: Self-pay | Admitting: Family Medicine

## 2021-04-02 DIAGNOSIS — I4510 Unspecified right bundle-branch block: Secondary | ICD-10-CM | POA: Insufficient documentation

## 2021-04-02 DIAGNOSIS — M48061 Spinal stenosis, lumbar region without neurogenic claudication: Secondary | ICD-10-CM | POA: Insufficient documentation

## 2021-04-02 DIAGNOSIS — H9211 Otorrhea, right ear: Secondary | ICD-10-CM | POA: Insufficient documentation

## 2021-04-02 DIAGNOSIS — R209 Unspecified disturbances of skin sensation: Secondary | ICD-10-CM | POA: Insufficient documentation

## 2021-04-02 DIAGNOSIS — M5000 Cervical disc disorder with myelopathy, unspecified cervical region: Secondary | ICD-10-CM | POA: Insufficient documentation

## 2021-04-02 NOTE — Assessment & Plan Note (Signed)
Incidental finding. Will monitor.

## 2021-04-02 NOTE — Assessment & Plan Note (Addendum)
1+ mo h/o posterior neck and L>R shoulder pain anticipate related to neck strain/spasm - rec conservative management with heating pad, gentle stretching (handout provided), and Rx robaxin muscle relaxant.  For some upper chest tightness endorsed - check EKG - anticipate non-cardiac

## 2021-04-02 NOTE — Assessment & Plan Note (Addendum)
Notes 2 months of increased skin sensitivity and heat sensation to R side that started after shingrix vaccine 01/21/2021. No improvement with prednisone course she completed through ortho for separate issue.  Temporal association to shingrix vaccine suggests this as possible etiology. She also notes significant increased family stressors recently - ?contribution.  Not consistent with allergic reaction or GBS.  Have recommended not get second shingrix vaccine, and given burning sensation will trial gabapentin course - discussed slow taper as well as side effects to monitor for. Update with effect.

## 2021-04-02 NOTE — Assessment & Plan Note (Signed)
R ear drainage associated with hearing loss in h/o previous injury to that TM. Exam today suspicious for re-perforation of R TM with ongoing thick white drainage from effusion - Rx abx course, keep ENT f/u planned for early next month.

## 2021-04-15 ENCOUNTER — Other Ambulatory Visit: Payer: Managed Care, Other (non HMO)

## 2021-04-27 ENCOUNTER — Telehealth: Payer: Self-pay | Admitting: Family Medicine

## 2021-04-27 DIAGNOSIS — R209 Unspecified disturbances of skin sensation: Secondary | ICD-10-CM

## 2021-04-27 NOTE — Telephone Encounter (Signed)
Patient advised on her voicemail. Sent to [place] office to schedule with providers that are available.

## 2021-04-27 NOTE — Telephone Encounter (Signed)
Referral placed. I believe Dr Erling Cruz retired years ago.

## 2021-04-27 NOTE — Telephone Encounter (Signed)
Mrs. Devine called in wanted to know about getting a referral for a neurologist due to the numbness on the right side, and the medication that was prescribed is not helping. And the Dr she would like is Dr. Morene Antu @ [facility].   Please advise

## 2021-04-27 NOTE — Telephone Encounter (Signed)
And she has stroke in her family as well

## 2021-04-29 ENCOUNTER — Telehealth: Payer: Self-pay

## 2021-04-29 NOTE — Telephone Encounter (Signed)
Spoke with patient who stated that last week she had an ear infection and was prescribed antibiotics and prednisone. Patient stated that Monday was her last dose of the prednisone and she felt like she "crashed from the high" of the medication. Patient stated that since then she has experienced dizziness when standing and walking, as well as blurred vision. Patient went to the ENT yesterday and they stated that everything was normal with her ear, but that her blood pressure was elevated. When she checked her B/P today it was 152/67. Patient is still on antibiotics and her last dose will be tomorrow night. Patient denied SOB, but states that she feels heaviness in her chest. Upon further assessment, patient stated that it doesn't feel like her heart, but it is more up near her collarbone. Instructed patient that she should be seen sooner rather than later. Patient stated that she is currently at work and cannot leave, but will go to UC when she gets off at around 12. Instructed patient that if symptoms get worse she should report to ED. Patient verbalized understanding.

## 2021-05-02 ENCOUNTER — Ambulatory Visit: Payer: Managed Care, Other (non HMO) | Admitting: Family Medicine

## 2021-05-02 ENCOUNTER — Other Ambulatory Visit: Payer: Self-pay

## 2021-05-02 ENCOUNTER — Encounter: Payer: Self-pay | Admitting: Family Medicine

## 2021-05-02 VITALS — BP 120/70 | HR 75 | Temp 98.2°F | Ht 67.75 in | Wt 195.0 lb

## 2021-05-02 DIAGNOSIS — M48061 Spinal stenosis, lumbar region without neurogenic claudication: Secondary | ICD-10-CM | POA: Diagnosis not present

## 2021-05-02 DIAGNOSIS — R209 Unspecified disturbances of skin sensation: Secondary | ICD-10-CM

## 2021-05-02 DIAGNOSIS — R42 Dizziness and giddiness: Secondary | ICD-10-CM | POA: Insufficient documentation

## 2021-05-02 DIAGNOSIS — R400 Somnolence: Secondary | ICD-10-CM | POA: Diagnosis not present

## 2021-05-02 NOTE — Assessment & Plan Note (Signed)
Orthostatic dizziness with sedation since on gabapentin.  Orthostatic vital signs normal today.  Anticipate gabapentin related - will drop dose to 200mg  nightly only, update with effect.  If symptoms persist, would consider recheck labwork - she will let us know .

## 2021-05-02 NOTE — Assessment & Plan Note (Signed)
H/o this followed by [facility]

## 2021-05-02 NOTE — Telephone Encounter (Signed)
Seen today. 

## 2021-05-02 NOTE — Patient Instructions (Addendum)
Orthostatic vital signs checked today and normal.  Symptoms could be related to gabapentin - drop gabapentin to 200mg  at bedtime only. If not better on lower gabapentin dose, let us know and we will check fasting blood work.  Let us know how you do on lower gabapentin.

## 2021-05-02 NOTE — Progress Notes (Signed)
Patient ID: Barbara Riley, female    DOB: 03/11/1966, 55 y.o.   MRN: [id number]  This visit was conducted in person.  BP 120/70 (BP Location: Right Arm, Cuff Size: Large)   Pulse 75   Temp 98.2 F (36.8 C) (Temporal)   Ht 5' 7.75" (1.721 m)   Wt 195 lb (88.5 kg)   SpO2 97%   BMI 29.87 kg/m   Orthostatic VS for the past 24 hrs (Last 3 readings):  BP- Lying BP- Standing at 3 minutes  05/02/21 1107 -- 114/80  05/02/21 1104 110/78 --      CC: dizziness Subjective:   HPI: Barbara Riley is a 55 y.o. female presenting on 05/02/2021 for Elevated Blood Pressure (C/o recent elevated BP readings at home and feeling "swimmy" headed.  Started about 1 wk ago.  Seen at ENT for possible vertigo, BP was 140 82.)   See recent phone note and office notes. Did not see [facility].  Pending neuro referral for ongoing R sided numbness down leg that started after first shingrix vaccine 01/21/2021. Gabapentin was somewhat effective for symptoms - she has also started b12 supplement. Continues gabapentin 200mg  BID.   Seen here last month with R external otitis with perf ear drum s/p ENT treatment with abx PO and ear drops and prednisone. Finished prednisone course on Monday, since then feeling orthostatic dizziness and blurry vision. Dizziness described as lightheadedness more than room spinning feeling. At latest ENT visit last week, BP elevated to 150/60s. Otherwise clear evaluation by ENT, ear fully healed.   Home BP overall stable - initial readings 150s but then on recheck after resting 110s.  Staying well hydrated.  Off mountain dew x2 wks.      Relevant past medical, surgical, family and social history reviewed and updated as indicated. Interim medical history since our last visit reviewed. Allergies and medications reviewed and updated. Outpatient Medications Prior to Visit  Medication Sig Dispense Refill  . calcium citrate-vitamin D (CITRACAL+D) 315-200 MG-UNIT tablet Take 1 tablet by mouth daily.     . Cholecalciferol (VITAMIN D3) 25 MCG (1000 UT) CAPS Take by mouth daily.    . cyanocobalamin 1000 MCG tablet Take 1,000 mcg by mouth daily.    . diphenhydrAMINE (BENADRYL) 25 mg capsule Take 1 capsule (25 mg total) by mouth every 6 (six) hours as needed.    . methocarbamol (ROBAXIN) 500 MG tablet Take 1 tablet (500 mg total) by mouth 3 (three) times daily as needed for muscle spasms (neck pain). 40 tablet 0  . Multiple Vitamin (MULTIVITAMIN) tablet Take 1 tablet by mouth daily.    . pravastatin (PRAVACHOL) 10 MG tablet Take 1 tablet (10 mg total) by mouth daily. 90 tablet 3  . escitalopram (LEXAPRO) 5 MG tablet escitalopram 5 mg tablet  Take 1 tablet every day by oral route.    . gabapentin (NEURONTIN) 100 MG capsule Take 1 capsule (100 mg total) by mouth at bedtime for 2 days, THEN 1 capsule (100 mg total) 2 (two) times daily for 2 days, THEN 2 capsules (200 mg total) 2 (two) times daily. 90 capsule 1  . escitalopram (LEXAPRO) 5 MG tablet Take 1 tablet (5 mg total) by mouth daily.    Marland Kitchen gabapentin (NEURONTIN) 100 MG capsule Take 2 capsules (200 mg total) by mouth at bedtime. 60 capsule 3  . azithromycin (ZITHROMAX) 250 MG tablet Take two tablets on day one followed by one tablet on days 2-5 6 each 0  No facility-administered medications prior to visit.     Per HPI unless specifically indicated in ROS section below Review of Systems Objective:  BP 120/70 (BP Location: Right Arm, Cuff Size: Large)   Pulse 75   Temp 98.2 F (36.8 C) (Temporal)   Ht 5' 7.75" (1.721 m)   Wt 195 lb (88.5 kg)   SpO2 97%   BMI 29.87 kg/m   Wt Readings from Last 3 Encounters:  05/02/21 195 lb (88.5 kg)  03/30/21 199 lb 4 oz (90.4 kg)  02/15/21 198 lb 8 oz (90 kg)      Physical Exam Vitals and nursing note reviewed.  Constitutional:      Appearance: Normal appearance. She is not ill-appearing.  HENT:     Head: Normocephalic and atraumatic.     Right Ear: Tympanic membrane, ear canal and external ear  normal. There is no impacted cerumen.     Left Ear: Tympanic membrane, ear canal and external ear normal. There is no impacted cerumen.  Eyes:     Extraocular Movements: Extraocular movements intact.     Conjunctiva/sclera: Conjunctivae normal.     Pupils: Pupils are equal, round, and reactive to light.  Cardiovascular:     Rate and Rhythm: Normal rate and regular rhythm.     Pulses: Normal pulses.     Heart sounds: Normal heart sounds. No murmur heard.   Pulmonary:     Effort: Pulmonary effort is normal. No respiratory distress.     Breath sounds: Normal breath sounds. No wheezing, rhonchi or rales.  Musculoskeletal:     Right lower leg: No edema.     Left lower leg: No edema.  Skin:    General: Skin is warm.     Findings: No rash.  Neurological:     Mental Status: She is alert.     Cranial Nerves: Cranial nerves are intact.     Sensory: Sensation is intact.     Motor: Motor function is intact.     Coordination: Coordination is intact.     Comments:  CN 2-12 intact FTN intact EOMI Nystagmus at end range lateral gaze bilaterally  Psychiatric:        Mood and Affect: Mood normal.        Behavior: Behavior normal.       Results for orders placed or performed in visit on 02/15/21  Hepatic function panel  Result Value Ref Range   Total Protein 6.7 6.0 - 8.5 g/dL   Albumin 4.3 3.8 - 4.9 g/dL   Bilirubin Total <0.2 0.0 - 1.2 mg/dL   Bilirubin, Direct <0.10 0.00 - 0.40 mg/dL   Alkaline Phosphatase 143 (H) 44 - 121 IU/L   AST 13 0 - 40 IU/L   ALT 21 0 - 32 IU/L  TSH  Result Value Ref Range   TSH 0.699 0.450 - 4.500 uIU/mL  Alkaline Phosphatase, Isoenzymes  Result Value Ref Range   LIVER FRACTION 72 18 - 85 %   BONE FRACTION 28 14 - 68 %   INTESTINAL FRAC. 0 0 - 18 %   Assessment & Plan:  This visit occurred during the SARS-CoV-2 public health emergency.  Safety protocols were in place, including screening questions prior to the visit, additional usage of staff PPE,  and extensive cleaning of exam room while observing appropriate contact time as indicated for disinfecting solutions.   Problem List Items Addressed This Visit    Disturbance of skin sensation    Ongoing after first shingrix  vaccine - pending neuro eval. See above.      Lumbar spinal stenosis    H/o this followed by [facility]      Orthostatic dizziness - Primary    Orthostatic dizziness with sedation since on gabapentin.  Orthostatic vital signs normal today.  Anticipate gabapentin related - will drop dose to 200mg  nightly only, update with effect.  If symptoms persist, would consider recheck labwork - she will let us know .        Other Visit Diagnoses    Somnolence           No orders of the defined types were placed in this encounter.  No orders of the defined types were placed in this encounter.   Patient Instructions  Orthostatic vital signs checked today and normal.  Symptoms could be related to gabapentin - drop gabapentin to 200mg  at bedtime only. If not better on lower gabapentin dose, let us know and we will check fasting blood work.  Let us know how you do on lower gabapentin.    Follow up plan: Return if symptoms worsen or fail to improve.  [name], MD

## 2021-05-02 NOTE — Assessment & Plan Note (Signed)
Ongoing after first shingrix vaccine - pending neuro eval. See above.

## 2021-05-16 ENCOUNTER — Other Ambulatory Visit: Payer: Self-pay | Admitting: Family Medicine

## 2021-05-18 HISTORY — PX: ANTERIOR CERVICAL DECOMP/DISCECTOMY FUSION: SHX1161

## 2021-05-23 ENCOUNTER — Ambulatory Visit: Payer: Managed Care, Other (non HMO) | Admitting: Neurology

## 2021-05-23 ENCOUNTER — Encounter: Payer: Self-pay | Admitting: Neurology

## 2021-05-23 ENCOUNTER — Telehealth: Payer: Self-pay | Admitting: Neurology

## 2021-05-23 VITALS — BP 130/81 | HR 69 | Ht 68.5 in | Wt 194.0 lb

## 2021-05-23 DIAGNOSIS — G373 Acute transverse myelitis in demyelinating disease of central nervous system: Secondary | ICD-10-CM

## 2021-05-23 NOTE — Progress Notes (Signed)
Reason for visit: Sensory alteration  Referring physician: Dr. [name] is a 55 y.o. female  History of present illness:  [name] is a 55 year old right-handed white female with a history of what was felt to be bilateral carpal tunnel syndrome.  The patient was seen by Dr. Amedeo Plenty in September 2021.  The patient was also seen by Dr. Gladstone Lighter for left hip bursitis issues.  The patient had a shingles vaccination in early February 2022.  Within a week following the vaccination, she began having some discomfort and sensation alteration on the left side of her mid back around the T5 level that gradually spread to the right side but went around the abdomen to the midline on the right, not on the left.  Over the ensuing weeks, the patient began having sensory alteration that spread down the right side into the right leg down to the foot.  The patient has a warm sensation in the back and down the right leg, she can no longer perceive temperature with her right leg, the left leg is normal.  She has not had any significant alteration in gait instability or leg strength, but she has had alteration in bladder function.  She now has urinary urgency and incontinence.  She cannot feel the right side of her rectum.  She denies any significant sensory alterations above the T5 level.  She still has some numbness in the hands, she also reports occasional pain in the left neck and down the left arm.  She reports some mild word finding problems but otherwise she has not had any sensation alteration on the face, and no vision or speech or swallowing alteration.  She denies headaches or dizziness.  Due to the above symptomatology, she is sent to this office for further evaluation.  Past Medical History:  Diagnosis Date  . Anxiety    Stress with toddler and bills  . ANXIETY 04/15/2007   Qualifier: Diagnosis of  By: Silvio Pate MD, [facility]   . Carpal tunnel syndrome    mild right sided (R median sensory  nerve only    Past Surgical History:  Procedure Laterality Date  . CESAREAN SECTION    . HYSTEROSCOPY WITH NOVASURE  08/2019   Dr Matthew Saras at [facility]  . MIDDLE EAR SURGERY  1985   perf repair on right    Family History  Problem Relation Age of Onset  . Heart disease Father 53       MI  . Stroke Father 42  . Stroke Paternal Grandfather   . Heart disease Other        GM  . Liver cancer Maternal Grandmother        GM  . CAD Brother 38       stents    Social history:  reports that she has quit smoking. Her smoking use included cigarettes. She smoked 0.25 packs per day. She has never used smokeless tobacco. She reports that she does not drink alcohol and does not use drugs.  Medications:  Prior to Admission medications   Medication Sig Start Date End Date Taking? Authorizing Provider  calcium citrate-vitamin D (CITRACAL+D) 315-200 MG-UNIT tablet Take 1 tablet by mouth daily.    [provider]  Cholecalciferol (VITAMIN D3) 25 MCG (1000 UT) CAPS Take by mouth daily.    [provider]  cyanocobalamin 1000 MCG tablet Take 1,000 mcg by mouth daily.    [provider]  diphenhydrAMINE (BENADRYL) 25  mg capsule Take 1 capsule (25 mg total) by mouth every 6 (six) hours as needed. 10/28/19   [name], MD  escitalopram (LEXAPRO) 5 MG tablet Take 1 tablet (5 mg total) by mouth daily. 05/02/21   [name], MD  gabapentin (NEURONTIN) 100 MG capsule Take 2 capsules (200 mg total) by mouth at bedtime. 05/02/21   [name], MD  methocarbamol (ROBAXIN) 500 MG tablet Take 1 tablet (500 mg total) by mouth 3 (three) times daily as needed for muscle spasms (neck pain). 03/30/21   [name], MD  Multiple Vitamin (MULTIVITAMIN) tablet Take 1 tablet by mouth daily.    [provider]  pravastatin (PRAVACHOL) 10 MG tablet Take 1 tablet (10 mg total) by mouth daily. 01/14/21   [name], MD      Allergies  Allergen  Reactions  . Penicillins     REACTION: rash  . Shingrix [Zoster Vac Recomb Adjuvanted] Other (See Comments)    Residual skin sensitivity/heat sensation to R side of body that started after vaccine    ROS:  Out of a complete 14 system review of symptoms, the patient complains only of the following symptoms, and all other reviewed systems are negative.  Altered sensation Urinary urgency and incontinence  Blood pressure 130/81, pulse 69, height 5' 8.5" (1.74 m), weight 194 lb (88 kg).  Physical Exam  General: The patient is alert and cooperative at the time of the examination.  The patient is minimally obese.  Eyes: Pupils are equal, round, and reactive to light. Discs are flat bilaterally.  Neck: The neck is supple, no carotid bruits are noted.  Respiratory: The respiratory examination is clear.  Cardiovascular: The cardiovascular examination reveals a regular rate and rhythm, no obvious murmurs or rubs are noted.  Skin: Extremities are without significant edema.  Neurologic Exam  Mental status: The patient is alert and oriented x 3 at the time of the examination. The patient has apparent normal recent and remote memory, with an apparently normal attention span and concentration ability.  Cranial nerves: Facial symmetry is present. There is good sensation of the face to pinprick and soft touch bilaterally. The strength of the facial muscles and the muscles to head turning and shoulder shrug are normal bilaterally. Speech is well enunciated, no aphasia or dysarthria is noted. Extraocular movements are full. Visual fields are full. The tongue is midline, and the patient has symmetric elevation of the soft palate. No obvious hearing deficits are noted.  Motor: The motor testing reveals 5 over 5 strength of all 4 extremities. Good symmetric motor tone is noted throughout.  Sensory: Sensory testing is intact to pinprick, soft touch, vibration sensation, and position sense on all 4  extremities, with exception of decreased pinprick sensation on the right leg compared to left, altered temperature sensation on the right leg as compared to the left. No evidence of extinction is noted.  Coordination: Cerebellar testing reveals good finger-nose-finger and heel-to-shin bilaterally.  Tinel's sign at the wrists are positive bilaterally.  Gait and station: Gait is normal. Tandem gait is slightly unsteady. Romberg is negative. No drift is seen.  The patient is able to walk on heels and the toes bilaterally.  Reflexes: Deep tendon reflexes are symmetric and normal bilaterally. Toes are downgoing bilaterally.   Assessment/Plan:  1.  Bilateral hand numbness and tingling, probable carpal tunnel syndrome  2.  Thoracic myelopathy, around the T5 level, left lateral cord  The patient has mainly pain and temperature sensation alteration at  the T5 level and below referable to a left lateral cord lesion.  Bladder control also is in the lateral spinal cord.  The patient will be sent for MRI of the cervical spine and thoracic spine.  The possibility of a partial transverse myelitis following the vaccination needs to be considered, but demyelinating disease is also in the differential.  We will need to exclude a compressive lesion.  She will follow-up in 3 months.  We may consider MRI of the brain depending upon the results of the above.  Jill Alexanders MD 05/23/2021 11:00 AM  [facility] [address] [place], [place] [address]  Phone [phone] Fax [phone]

## 2021-05-23 NOTE — Telephone Encounter (Signed)
MR cervical w/wo & MR thoracic w/wo Christella Scheuermann auth: gi obtains- sent to gi for scheduling

## 2021-05-29 ENCOUNTER — Ambulatory Visit
Admission: RE | Admit: 2021-05-29 | Discharge: 2021-05-29 | Disposition: A | Discharge status: Home / Self Care | Payer: Managed Care, Other (non HMO) | Source: Ambulatory Visit | Attending: Neurology | Admitting: Neurology

## 2021-05-29 DIAGNOSIS — G373 Acute transverse myelitis in demyelinating disease of central nervous system: Secondary | ICD-10-CM

## 2021-05-29 MED ORDER — GADOBENATE DIMEGLUMINE 529 MG/ML IV SOLN
18.0000 mL | Freq: Once | INTRAVENOUS | Status: AC | PRN
  Administered 2021-05-29: 18 mL via INTRAVENOUS

## 2021-05-30 ENCOUNTER — Telehealth: Payer: Self-pay | Admitting: Neurology

## 2021-05-30 ENCOUNTER — Telehealth: Payer: Self-pay

## 2021-05-30 DIAGNOSIS — G959 Disease of spinal cord, unspecified: Secondary | ICD-10-CM

## 2021-05-30 NOTE — Telephone Encounter (Signed)
I called the patient.  MRI of the cervical spine reveals spinal cord compression from a herniated disc at the C4-5 level with increased cord signal indicating injury.  Thoracic spinal cord is normal.  I will make a referral to neurosurgery for surgical decompression.    MRI cervical 05/29/21:  IMPRESSION: This MRI of the cervical spine with and without contrast shows the following: 1.   At C4-C5, there is severe spinal stenosis and abnormal signal within the cord consistent with a compressive myelopathy due to a left paramedian disc herniation. 2.   At C5-C6, there are degenerative changes causing mild spinal stenosis and moderate foraminal narrowing with some encroachment upon the C6 nerve root but no definite nerve root compression. 3.   Mild degenerative changes at other levels that do not lead to spinal cord compression or nerve root compression 4.  Normal enhancement pattern.   MRI thoracic 05/29/21:  IMPRESSION: This MRI of the thoracic spine with and without contrast shows the following: 1.   The spinal cord appears normal. 2.   Mild degenerative changes at T11-T12 with ligamenta flava hypertrophy but no nerve root compression or spinal stenosis. 3.   Normal enhancement pattern.

## 2021-05-30 NOTE — Telephone Encounter (Signed)
Referral for neurosurgery sent to [facility]. P: [phone].

## 2021-06-07 ENCOUNTER — Other Ambulatory Visit: Payer: Self-pay | Admitting: Family Medicine

## 2021-06-10 ENCOUNTER — Encounter: Payer: Self-pay | Admitting: Family Medicine

## 2021-07-16 IMAGING — DX DG CHEST 2V
2 series · 2 of 2 positions shown · non-contrast
Comparison: 08/29/2020

CLINICAL DATA: Follow-up

EXAM:
CHEST - 2 VIEW

[chest pa]
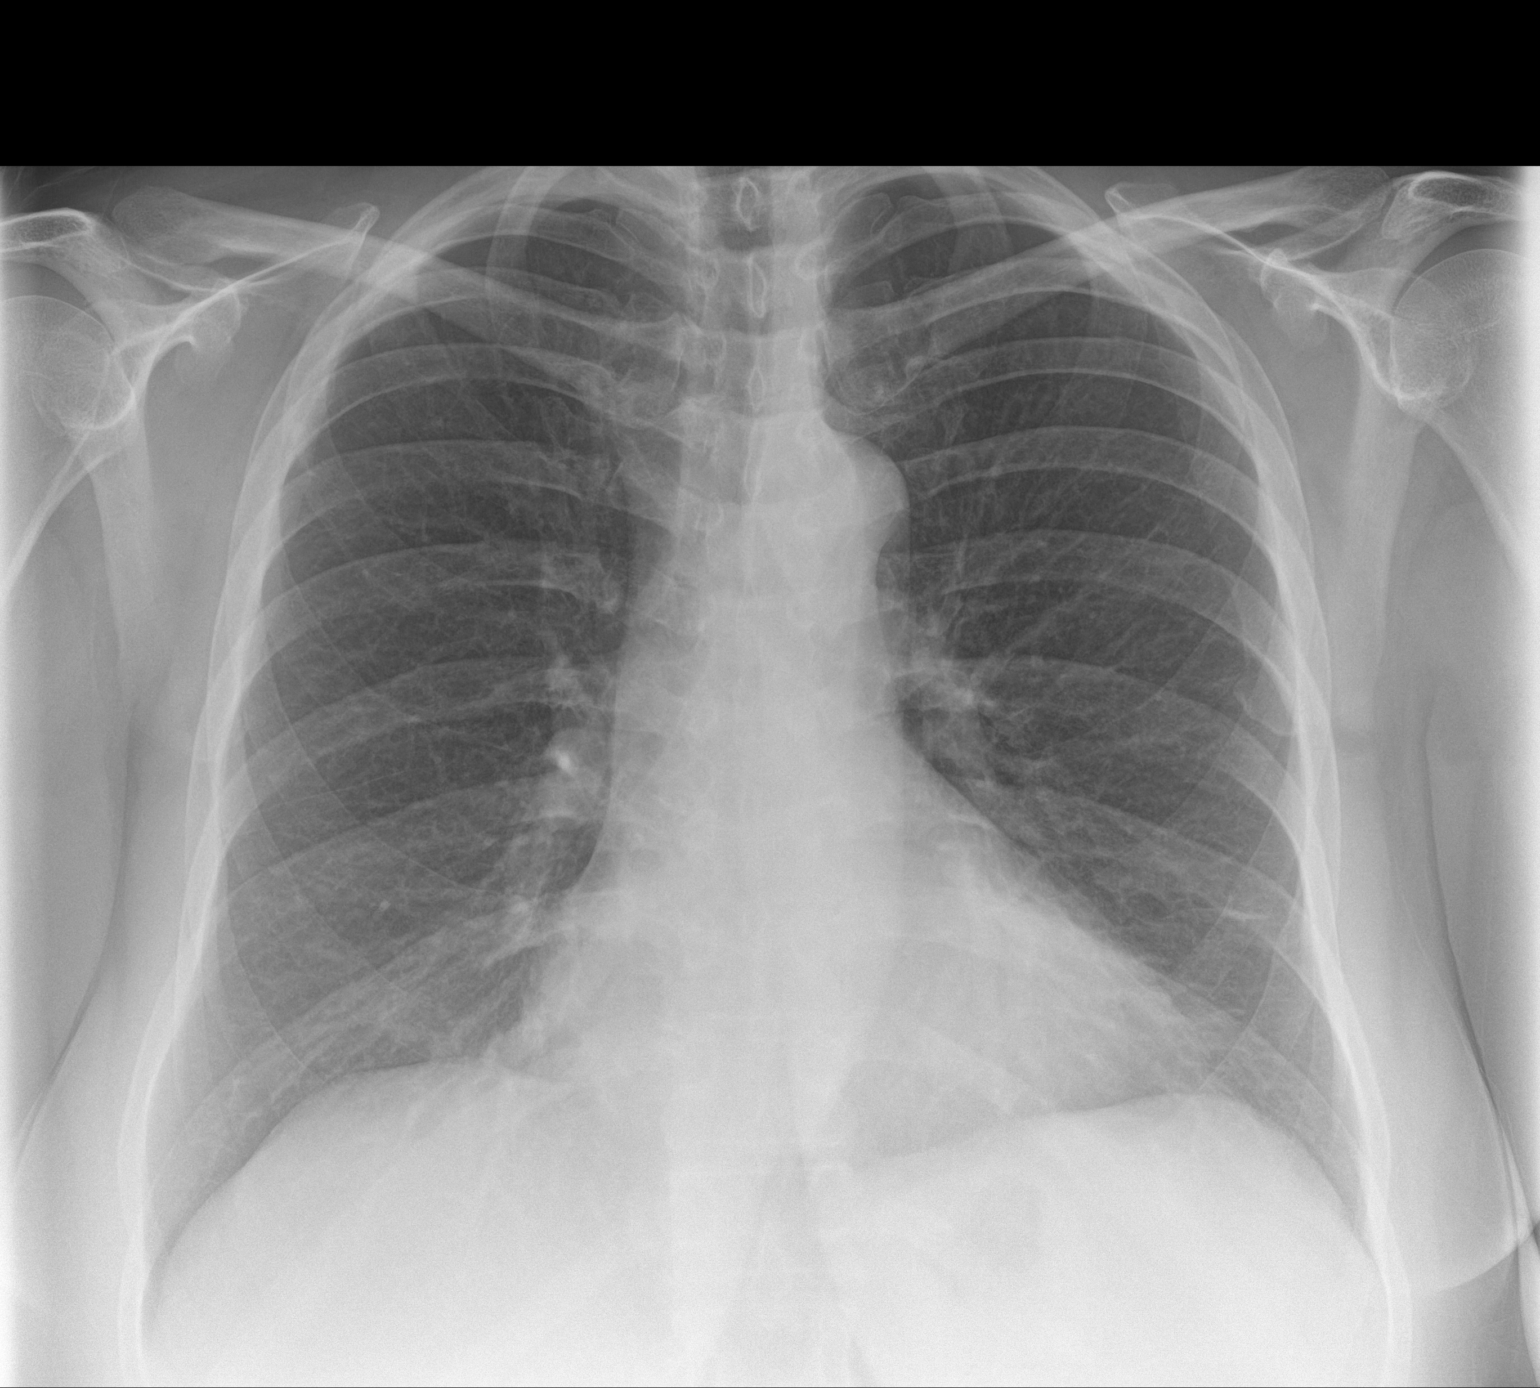

[chest lat]
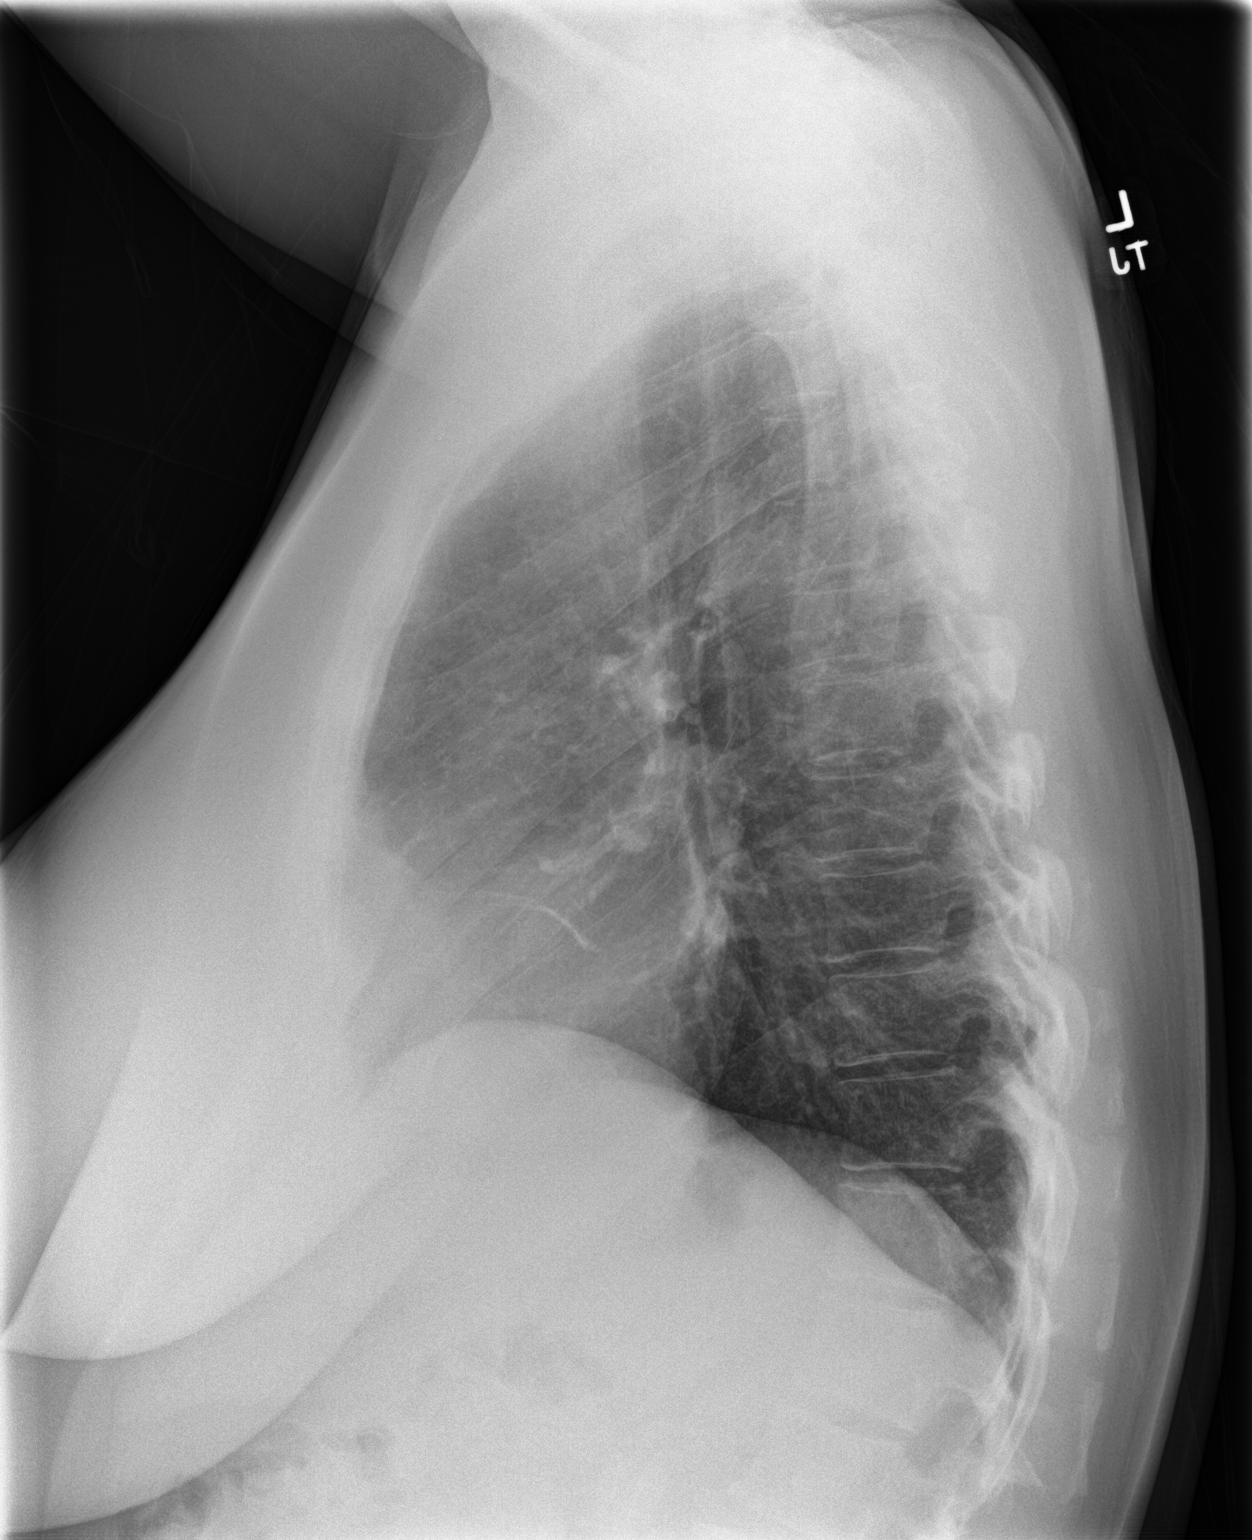

[2 of 2 positions shown; findings below may reference images not displayed]

FINDINGS: Previously seen patchy opacities in the right upper lung no longer
visualized. Lungs are clear except for minimal linear densities in
the lingula which are stable, likely scarring. Heart is normal size.
No effusions. No acute bony abnormality.
IMPRESSION: No active cardiopulmonary disease.

## 2021-08-05 ENCOUNTER — Ambulatory Visit: Payer: Self-pay | Admitting: Neurology

## 2021-08-29 ENCOUNTER — Encounter: Payer: Self-pay | Admitting: Neurology

## 2021-08-29 ENCOUNTER — Ambulatory Visit: Payer: Managed Care, Other (non HMO) | Admitting: Neurology

## 2021-08-29 DIAGNOSIS — G959 Disease of spinal cord, unspecified: Secondary | ICD-10-CM

## 2021-08-29 HISTORY — DX: Disease of spinal cord, unspecified: G95.9

## 2021-08-29 NOTE — Progress Notes (Signed)
Reason for visit: Cervical myelopathy  Barbara Riley is an 55 y.o. female  History of present illness:  Barbara Riley is a 55 year old right-handed white female with a history of sensory alteration on the right body from the T5 level down that splits the midline.  The patient was found to have a herniated cervical disc at the C4-5 level impinging upon the lateral spinal cord.  The patient underwent decompressive surgery on 14 June 2021 through Dr. Trenton Gammon.  The patient has not regained sensation on the right side, but the tingling in the hands that she was having has improved some.  Previously, this was felt to be due to carpal tunnel syndrome.  The patient is now on gabapentin for her neuropathic discomfort and burning sensations taking 300 mg 3 times daily.  She gets this medication through Dr. Trenton Gammon.  She returns for an evaluation today.  She believes that her gait stability has improved, she has not had any falls.  Past Medical History:  Diagnosis Date   Anxiety    Stress with toddler and bills   ANXIETY 04/15/2007   Qualifier: Diagnosis of  By: Silvio Pate MD, [facility]    Carpal tunnel syndrome    mild right sided (R median sensory nerve only    Past Surgical History:  Procedure Laterality Date   CESAREAN SECTION     HYSTEROSCOPY WITH NOVASURE  08/2019   Dr Matthew Saras at [facility]   perf repair on right    Family History  Problem Relation Age of Onset   Heart disease Father 84       MI   Stroke Father 43   Stroke Paternal Grandfather    Heart disease Other        GM   Liver cancer Maternal Grandmother        GM   CAD Brother 40       stents    Social history:  reports that she has quit smoking. Her smoking use included cigarettes. She smoked an average of .25 packs per day. She has never used smokeless tobacco. She reports that she does not drink alcohol and does not use drugs.    Allergies  Allergen Reactions   Penicillins      REACTION: rash   Shingrix [Zoster Vac Recomb Adjuvanted] Other (See Comments)    Residual skin sensitivity/heat sensation to R side of body that started after vaccine    Medications:  Prior to Admission medications   Medication Sig Start Date End Date Taking? Authorizing Provider  gabapentin (NEURONTIN) 300 MG capsule gabapentin 300 mg capsule  TAKE 1 CAPSULE BY MOUTH THREE TIMES DAILY 08/24/21  Yes [provider]  calcium citrate-vitamin D (CITRACAL+D) 315-200 MG-UNIT tablet Take 1 tablet by mouth daily.    [provider]  Cholecalciferol (VITAMIN D3) 25 MCG (1000 UT) CAPS Take by mouth daily.    [provider]  cyanocobalamin 1000 MCG tablet Take 1,000 mcg by mouth daily.    [provider]  diphenhydrAMINE (BENADRYL) 25 mg capsule Take 1 capsule (25 mg total) by mouth every 6 (six) hours as needed. 10/28/19   [name], MD  escitalopram (LEXAPRO) 5 MG tablet Take 1 tablet (5 mg total) by mouth daily. 05/02/21   [name], MD  gabapentin (NEURONTIN) 100 MG capsule Take 2 capsules (200 mg total) by mouth at bedtime. 05/02/21   [name], MD  methocarbamol (ROBAXIN) 500 MG  tablet Take 1 tablet (500 mg total) by mouth 3 (three) times daily as needed for muscle spasms (neck pain). Patient not taking: Reported on 05/23/2021 03/30/21   [name], MD  Multiple Vitamin (MULTIVITAMIN) tablet Take 1 tablet by mouth daily.    [provider]  pravastatin (PRAVACHOL) 10 MG tablet Take 1 tablet (10 mg total) by mouth daily. 01/14/21   [name], MD    ROS:  Out of a complete 14 system review of symptoms, the patient complains only of the following symptoms, and all other reviewed systems are negative.  Numbness, burning sensations  Height '5\' 8"'$  (1.727 m), weight 188 lb (85.3 kg).  Physical Exam  General: The patient is alert and cooperative at the time of the examination.  Skin: No significant peripheral edema is  noted.   Neurologic Exam  Mental status: The patient is alert and oriented x 3 at the time of the examination. The patient has apparent normal recent and remote memory, with an apparently normal attention span and concentration ability.   Cranial nerves: Facial symmetry is present. Speech is normal, no aphasia or dysarthria is noted. Extraocular movements are full. Visual fields are full.  Motor: The patient has good strength in all 4 extremities.  Sensory examination: Soft touch sensation is symmetric on the face, arms, and legs.  Coordination: The patient has good finger-nose-finger and heel-to-shin bilaterally.  Gait and station: The patient has a normal gait. Tandem gait is minimally unsteady. Romberg is negative. No drift is seen.  Reflexes: Deep tendon reflexes are symmetric.   MRI cervical 05/29/21:   IMPRESSION: This MRI of the cervical spine with and without contrast shows the following: 1.   At C4-C5, there is severe spinal stenosis and abnormal signal within the cord consistent with a compressive myelopathy due to a left paramedian disc herniation. 2.   At C5-C6, there are degenerative changes causing mild spinal stenosis and moderate foraminal narrowing with some encroachment upon the C6 nerve root but no definite nerve root compression. 3.   Mild degenerative changes at other levels that do not lead to spinal cord compression or nerve root compression 4.  Normal enhancement pattern.  * MRI scan images were reviewed online. I agree with the written report.      MRI thoracic 05/29/21:   IMPRESSION: This MRI of the thoracic spine with and without contrast shows the following: 1.   The spinal cord appears normal. 2.   Mild degenerative changes at T11-T12 with ligamenta flava hypertrophy but no nerve root compression or spinal stenosis. 3.   Normal enhancement pattern.   Assessment/Plan:  1.  Cervical myelopathy, C4-5 level  The patient likely will have some sensory  alterations on the right body that are permanent.  The decompressive surgery however has stabilized her condition.  She will follow-up here through our office on an as-needed basis.  Jill Alexanders MD 08/29/2021 3:49 PM  [facility] [address] [place], [place] [address]  Phone [phone] Fax [phone]

## 2021-10-19 LAB — HM MAMMOGRAPHY

## 2022-03-02 ENCOUNTER — Encounter: Payer: Self-pay | Admitting: Internal Medicine

## 2022-03-02 ENCOUNTER — Ambulatory Visit: Payer: Managed Care, Other (non HMO) | Admitting: Internal Medicine

## 2022-03-02 ENCOUNTER — Other Ambulatory Visit: Payer: Self-pay

## 2022-03-02 VITALS — BP 118/76 | HR 83 | Temp 98.2°F | Ht 68.0 in | Wt 195.2 lb

## 2022-03-02 DIAGNOSIS — R300 Dysuria: Secondary | ICD-10-CM | POA: Diagnosis not present

## 2022-03-02 DIAGNOSIS — R399 Unspecified symptoms and signs involving the genitourinary system: Secondary | ICD-10-CM | POA: Diagnosis not present

## 2022-03-02 MED ORDER — PHENAZOPYRIDINE HCL 200 MG PO TABS: 200.0000 mg | ORAL_TABLET | Freq: Three times a day (TID) | ORAL | 0 refills | Status: DC | PRN

## 2022-03-02 NOTE — Progress Notes (Signed)
Chief Complaint  ?Patient presents with  ? Urinary Tract Infection  ?  Onset of one week ago. Patient having low abdominal pressure, urinary frequency, and burning/painful urination.   ? ?Acute visit  ?Possible uti onset last week with pressure, burning with urination, vaginal odor w/o discharge started this week tried otc azo ab pain 6/10 nothing tried will try tyllenol prn  ? ? ?Review of Systems  ?Constitutional:  Negative for weight loss.  ?HENT:  Negative for hearing loss.   ?Eyes:  Negative for blurred vision.  ?Respiratory:  Negative for shortness of breath.   ?Cardiovascular:  Negative for chest pain.  ?Gastrointestinal:  Positive for abdominal pain. Negative for blood in stool.  ?Genitourinary:  Positive for dysuria.  ?Musculoskeletal:  Negative for falls and joint pain.  ?Skin:  Negative for rash.  ?Neurological:  Negative for headaches.  ?Psychiatric/Behavioral:  Negative for depression.   ?Past Medical History:  ?Diagnosis Date  ? Anxiety   ? Stress with toddler and bills  ? ANXIETY 04/15/2007  ? Qualifier: Diagnosis of  By: Silvio Pate MD, [facility]   ? Carpal tunnel syndrome   ? mild right sided (R median sensory nerve only  ? Cervical myelopathy ([place]) 08/29/2021  ? C4-5 level  ? ?Past Surgical History:  ?Procedure Laterality Date  ? CESAREAN SECTION    ? HYSTEROSCOPY WITH NOVASURE  08/2019  ? Dr Matthew Saras at [facility]  ? [place]  ? perf repair on right  ? ?Family History  ?Problem Relation Age of Onset  ? Heart disease Father 51  ?     MI  ? Stroke Father 94  ? Stroke Paternal Grandfather   ? Heart disease Other   ?     GM  ? Liver cancer Maternal Grandmother   ?     GM  ? CAD Brother 22  ?     stents  ? ?Social History  ? ?Socioeconomic History  ? Marital status: Married  ?  Spouse name: Not on file  ? Number of children: 1  ? Years of education: Not on file  ? Highest education level: Not on file  ?Occupational History  ? 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  Employer: [employer]   ?Tobacco Use  ? Smoking status: Former  ?  Packs/day: 0.25  ?  Types: Cigarettes  ? Smokeless tobacco: Never  ? Tobacco comments:  ?  4-5 cigarettes daily  ?Vaping Use  ? Vaping Use: Never used  ?Substance and Sexual Activity  ? Alcohol use: No  ?  Alcohol/week: 0.0 standard drinks  ? Drug use: No  ? Sexual activity: Yes  ?  Birth control/protection: None  ?Other Topics Concern  ? Not on file  ?Social History Narrative  ? Caffeine: mtn dew zero occasionally, 2-3 cups of sweet tea at night   ? Married, lives with husband and son    ? Occupation: [employer] employee  ? Activity: no regular exercise, some walking at flea market   ? Diet: some water, fruits/vegetables daily, brings lunch from home  ?    ? ?Social Determinants of Health  ? ?Financial Resource Strain: Not on file  ?Food Insecurity: Not on file  ?Transportation Needs: Not on file  ?Physical Activity: Not on file  ?Stress: Not on file  ?Social Connections: Not on file  ?Intimate Partner Violence: Not on file  ? ?Current Meds  ?Medication Sig  ? calcium citrate-vitamin D (CITRACAL+D) 315-200 MG-UNIT tablet Take  1 tablet by mouth daily.  ? Cholecalciferol (VITAMIN D3) 25 MCG (1000 UT) CAPS Take by mouth daily.  ? cyanocobalamin 1000 MCG tablet Take 1,000 mcg by mouth daily.  ? escitalopram (LEXAPRO) 5 MG tablet Take 1 tablet (5 mg total) by mouth daily.  ? gabapentin (NEURONTIN) 300 MG capsule Take 300 mg by mouth daily.  ? Multiple Vitamin (MULTIVITAMIN) tablet Take 1 tablet by mouth daily.  ? phenazopyridine (PYRIDIUM) 200 MG tablet Take 1 tablet (200 mg total) by mouth 3 (three) times daily as needed for pain.  ? pravastatin (PRAVACHOL) 10 MG tablet Take 1 tablet (10 mg total) by mouth daily.  ? ?Allergies  ?Allergen Reactions  ? Penicillins   ?  REACTION: rash  ? Shingrix [Zoster Vac Recomb Adjuvanted] Other (See Comments)  ?  Residual skin sensitivity/heat sensation to R side of body that started after vaccine  ? ?No results found for this or any previous  visit (from the past 2160 hour(s)). ?Objective  ?Body mass index is 29.68 kg/m?. ?Wt Readings from Last 3 Encounters:  ?03/02/22 195 lb 3.2 oz (88.5 kg)  ?08/29/21 188 lb (85.3 kg)  ?05/23/21 194 lb (88 kg)  ? ?Temp Readings from Last 3 Encounters:  ?03/02/22 98.2 ?F (36.8 ?C) (Oral)  ?05/02/21 98.2 ?F (36.8 ?C) (Temporal)  ?03/30/21 97.9 ?F (36.6 ?C) (Temporal)  ? ?BP Readings from Last 3 Encounters:  ?03/02/22 118/76  ?08/29/21 120/75  ?05/23/21 130/81  ? ?Pulse Readings from Last 3 Encounters:  ?03/02/22 83  ?08/29/21 80  ?05/23/21 69  ? ? ?Physical Exam ?Vitals and nursing note reviewed.  ?Constitutional:   ?   Appearance: Normal appearance. She is well-developed and well-groomed.  ?HENT:  ?   Head: Normocephalic and atraumatic.  ?Eyes:  ?   Conjunctiva/sclera: Conjunctivae normal.  ?   Pupils: Pupils are equal, round, and reactive to light.  ?Cardiovascular:  ?   Rate and Rhythm: Normal rate and regular rhythm.  ?   Heart sounds: Normal heart sounds. No murmur heard. ?Pulmonary:  ?   Effort: Pulmonary effort is normal.  ?   Breath sounds: Normal breath sounds.  ?Abdominal:  ?   General: Abdomen is flat. Bowel sounds are normal.  ?   Tenderness: There is abdominal tenderness in the suprapubic area.  ?   Comments: Moderate to severe ab pain  ?  ?Musculoskeletal:     ?   General: No tenderness.  ?Skin: ?   General: Skin is warm and dry.  ?Neurological:  ?   General: No focal deficit present.  ?   Mental Status: She is alert and oriented to person, place, and time. Mental status is at baseline.  ?   Cranial Nerves: Cranial nerves 2-12 are intact.  ?   Sensory: Sensation is intact.  ?   Motor: Motor function is intact.  ?   Coordination: Coordination is intact.  ?   Gait: Gait is intact.  ?Psychiatric:     ?   Attention and Perception: Attention and perception normal.     ?   Mood and Affect: Mood and affect normal.     ?   Speech: Speech normal.     ?   Behavior: Behavior normal. Behavior is cooperative.     ?    Thought Content: Thought content normal.     ?   Cognition and Memory: Cognition and memory normal.     ?   Judgment: Judgment normal.  ? ? ?Assessment  ?  Plan  ?UTI symptoms - Plan: Urinalysis, Urine Culture, phenazopyridine (PYRIDIUM) 200 MG tablet tid prn pt wants to wait on Abx consider cipro + diflucan for yeast  ?Pending UA back tomorrow ? ?F/u PCP in 3 months ? ? ?Provider: Dr. Olivia Mackie McLean-Scocuzza-Internal Medicine  ?

## 2022-03-02 NOTE — Patient Instructions (Addendum)
Try cranberry juice light or supplements  ? ?Urinary Tract Infection, Adult ?A urinary tract infection (UTI) is an infection of any part of the urinary tract. The urinary tract includes the kidneys, ureters, bladder, and urethra. These organs make, store, and get rid of urine in the body. ?An upper UTI affects the ureters and kidneys. A lower UTI affects the bladder and urethra. ?What are the causes? ?Most urinary tract infections are caused by bacteria in your genital area around your urethra, where urine leaves your body. These bacteria grow and cause inflammation of your urinary tract. ?What increases the risk? ?You are more likely to develop this condition if: ?You have a urinary catheter that stays in place. ?You are not able to control when you urinate or have a bowel movement (incontinence). ?You are female and you: ?Use a spermicide or diaphragm for birth control. ?Have low estrogen levels. ?Are pregnant. ?You have certain genes that increase your risk. ?You are sexually active. ?You take antibiotic medicines. ?You have a condition that causes your flow of urine to slow down, such as: ?An enlarged prostate, if you are female. ?Blockage in your urethra. ?A kidney stone. ?A nerve condition that affects your bladder control (neurogenic bladder). ?Not getting enough to drink, or not urinating often. ?You have certain medical conditions, such as: ?Diabetes. ?A weak disease-fighting system (immunesystem). ?Sickle cell disease. ?Gout. ?Spinal cord injury. ?What are the signs or symptoms? ?Symptoms of this condition include: ?Needing to urinate right away (urgency). ?Frequent urination. This may include small amounts of urine each time you urinate. ?Pain or burning with urination. ?Blood in the urine. ?Urine that smells bad or unusual. ?Trouble urinating. ?Cloudy urine. ?Vaginal discharge, if you are female. ?Pain in the abdomen or the lower back. ?You may also have: ?Vomiting or a decreased  appetite. ?Confusion. ?Irritability or tiredness. ?A fever or chills. ?Diarrhea. ?The first symptom in older adults may be confusion. In some cases, they may not have any symptoms until the infection has worsened. ?How is this diagnosed? ?This condition is diagnosed based on your medical history and a physical exam. You may also have other tests, including: ?Urine tests. ?Blood tests. ?Tests for STIs (sexually transmitted infections). ?If you have had more than one UTI, a cystoscopy or imaging studies may be done to determine the cause of the infections. ?How is this treated? ?Treatment for this condition includes: ?Antibiotic medicine. ?Over-the-counter medicines to treat discomfort. ?Drinking enough water to stay hydrated. ?If you have frequent infections or have other conditions such as a kidney stone, you may need to see a health care provider who specializes in the urinary tract (urologist). ?In rare cases, urinary tract infections can cause sepsis. Sepsis is a life-threatening condition that occurs when the body responds to an infection. Sepsis is treated in the hospital with IV antibiotics, fluids, and other medicines. ?Follow these instructions at home: ?Medicines ?Take over-the-counter and prescription medicines only as told by your health care provider. ?If you were prescribed an antibiotic medicine, take it as told by your health care provider. Do not stop using the antibiotic even if you start to feel better. ?General instructions ?Make sure you: ?Empty your bladder often and completely. Do not hold urine for long periods of time. ?Empty your bladder after sex. ?Wipe from front to back after urinating or having a bowel movement if you are female. Use each tissue only one time when you wipe. ?Drink enough fluid to keep your urine pale yellow. ?Keep all follow-up visits.  This is important. ?Contact a health care provider if: ?Your symptoms do not get better after 1-2 days. ?Your symptoms go away and then  return. ?Get help right away if: ?You have severe pain in your back or your lower abdomen. ?You have a fever or chills. ?You have nausea or vomiting. ?Summary ?A urinary tract infection (UTI) is an infection of any part of the urinary tract, which includes the kidneys, ureters, bladder, and urethra. ?Most urinary tract infections are caused by bacteria in your genital area. ?Treatment for this condition often includes antibiotic medicines. ?If you were prescribed an antibiotic medicine, take it as told by your health care provider. Do not stop using the antibiotic even if you start to feel better. ?Keep all follow-up visits. This is important. ?This information is not intended to replace advice given to you by your health care provider. Make sure you discuss any questions you have with your health care provider. ?Document Revised: 07/16/2020 Document Reviewed: 07/16/2020 ?Elsevier Patient Education ? [place]. ? ?

## 2022-03-04 LAB — URINALYSIS
Bilirubin Urine: NEGATIVE
Glucose, UA: NEGATIVE
Hgb urine dipstick: NEGATIVE
Ketones, ur: NEGATIVE
Nitrite: POSITIVE — AB
Protein, ur: NEGATIVE
Specific Gravity, Urine: 1.01 (ref 1.001–1.035)
pH: 5.5 (ref 5.0–8.0)

## 2022-03-04 LAB — URINE CULTURE
MICRO NUMBER:: 13140207
SPECIMEN QUALITY:: ADEQUATE

## 2022-03-05 ENCOUNTER — Other Ambulatory Visit: Payer: Self-pay | Admitting: Internal Medicine

## 2022-03-05 DIAGNOSIS — R300 Dysuria: Secondary | ICD-10-CM

## 2022-03-05 DIAGNOSIS — B3731 Acute candidiasis of vulva and vagina: Secondary | ICD-10-CM

## 2022-03-05 DIAGNOSIS — N3000 Acute cystitis without hematuria: Secondary | ICD-10-CM

## 2022-03-05 DIAGNOSIS — R399 Unspecified symptoms and signs involving the genitourinary system: Secondary | ICD-10-CM

## 2022-03-05 MED ORDER — FLUCONAZOLE 150 MG PO TABS: 150.0000 mg | ORAL_TABLET | Freq: Once | ORAL | 0 refills | Status: AC

## 2022-03-05 MED ORDER — CIPROFLOXACIN HCL 500 MG PO TABS: 500.0000 mg | ORAL_TABLET | Freq: Two times a day (BID) | ORAL | 0 refills | Status: AC

## 2022-03-06 ENCOUNTER — Telehealth: Payer: Self-pay

## 2022-03-06 NOTE — Telephone Encounter (Signed)
Called pt to inform her about urine culture results.  ? ?Per Dr.Tracy: ?You have bacteria in your urine e coli appears start of UTI  ?Sent cipro antibiotic 2x per day with food x 5 days and diflucan the yeast pill if needed  ?

## 2022-03-16 ENCOUNTER — Ambulatory Visit: Payer: Managed Care, Other (non HMO) | Admitting: Family

## 2022-03-16 ENCOUNTER — Encounter: Payer: Self-pay | Admitting: Family

## 2022-03-16 VITALS — BP 116/64 | HR 72 | Temp 99.0°F | Resp 16 | Ht 68.0 in | Wt 197.1 lb

## 2022-03-16 DIAGNOSIS — H8113 Benign paroxysmal vertigo, bilateral: Secondary | ICD-10-CM | POA: Insufficient documentation

## 2022-03-16 DIAGNOSIS — H6591 Unspecified nonsuppurative otitis media, right ear: Secondary | ICD-10-CM | POA: Insufficient documentation

## 2022-03-16 DIAGNOSIS — H9211 Otorrhea, right ear: Secondary | ICD-10-CM | POA: Insufficient documentation

## 2022-03-16 MED ORDER — CEFDINIR 300 MG PO CAPS: 300.0000 mg | ORAL_CAPSULE | Freq: Two times a day (BID) | ORAL | 0 refills | Status: AC

## 2022-03-16 MED ORDER — METHYLPREDNISOLONE 4 MG PO TBPK: ORAL_TABLET | ORAL | 0 refills | Status: DC

## 2022-03-16 MED ORDER — MECLIZINE HCL 25 MG PO TABS: 25.0000 mg | ORAL_TABLET | Freq: Two times a day (BID) | ORAL | 0 refills | Status: AC | PRN

## 2022-03-16 MED ORDER — OFLOXACIN 0.3 % OT SOLN: 5.0000 [drp] | Freq: Every day | OTIC | 0 refills | Status: AC

## 2022-03-16 NOTE — Assessment & Plan Note (Addendum)
rx meclizine 25 mg prn  ?Rise slow ?Handout given to pt  ?rx steroid for ear fluid ?

## 2022-03-16 NOTE — Assessment & Plan Note (Addendum)
rx cefdinir 300 mg  ?Take antibiotic as prescribed. Increase oral fluids. Pt to f/u if sx worsen and or fail to improve in 2-3 days. ? ?

## 2022-03-16 NOTE — Assessment & Plan Note (Addendum)
rx ofloxacin 0.3% sent to pharmacy  ?F/u if no improvement ?

## 2022-03-16 NOTE — Progress Notes (Signed)
? ?Established Patient Office Visit ? ?Subjective:  ?Patient ID: Barbara Riley, female    DOB: 10-Aug-1966  Age: 56 y.o. MRN: [id number] ? ?CC:  ?Chief Complaint  ?Patient presents with  ? Ear Drainage  ? Dizziness  ? ? ?HPI ?Barbara Riley is here today with concerns.  ? ?Two weekends ago woke up in the middle of the night to go to the bathroom and when she got up she felt like she was walking sideways. Last weekend woke up again and felt off balance. Today is pretty bad, she feels pressure right ear and sinus pressure. Feeling very dizzy. When she goes to walk she feels her equilibrium is way off. She is not nauseous or throwing up.  ? ?No fever or chills.  ?No sore throat.  ?No cough or chest congestion.  ? ?Past Medical History:  ?Diagnosis Date  ? Anxiety   ? Stress with toddler and bills  ? ANXIETY 04/15/2007  ? Qualifier: Diagnosis of  By: Silvio Pate MD, [facility]   ? Carpal tunnel syndrome   ? mild right sided (R median sensory nerve only  ? Cervical myelopathy ([place]) 08/29/2021  ? C4-5 level  ? ? ?Past Surgical History:  ?Procedure Laterality Date  ? CESAREAN SECTION    ? HYSTEROSCOPY WITH NOVASURE  08/2019  ? Dr Matthew Saras at [facility]  ? [place]  ? perf repair on right  ? ? ?Family History  ?Problem Relation Age of Onset  ? Heart disease Father 28  ?     MI  ? Stroke Father 48  ? Stroke Paternal Grandfather   ? Heart disease Other   ?     GM  ? Liver cancer Maternal Grandmother   ?     GM  ? CAD Brother 81  ?     stents  ? ? ?Social History  ? ?Socioeconomic History  ? Marital status: Married  ?  Spouse name: Not on file  ? Number of children: 1  ? Years of education: Not on file  ? Highest education level: Not on file  ?Occupational History  ? 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  Employer: [employer]  ?Tobacco Use  ? Smoking status: Former  ?  Packs/day: 0.25  ?  Types: Cigarettes  ? Smokeless tobacco: Never  ? Tobacco comments:  ?  4-5 cigarettes daily  ?Vaping Use  ? Vaping Use: Never  used  ?Substance and Sexual Activity  ? Alcohol use: No  ?  Alcohol/week: 0.0 standard drinks  ? Drug use: No  ? Sexual activity: Yes  ?  Birth control/protection: None  ?Other Topics Concern  ? Not on file  ?Social History Narrative  ? Caffeine: mtn dew zero occasionally, 2-3 cups of sweet tea at night   ? Married, lives with husband and son    ? Occupation: [employer] employee  ? Activity: no regular exercise, some walking at flea market   ? Diet: some water, fruits/vegetables daily, brings lunch from home  ?    ? ?Social Determinants of Health  ? ?Financial Resource Strain: Not on file  ?Food Insecurity: Not on file  ?Transportation Needs: Not on file  ?Physical Activity: Not on file  ?Stress: Not on file  ?Social Connections: Not on file  ?Intimate Partner Violence: Not on file  ? ? ?Outpatient Medications Prior to Visit  ?Medication Sig Dispense Refill  ? calcium citrate-vitamin D (CITRACAL+D) 315-200 MG-UNIT tablet Take 1 tablet  by mouth daily.    ? Cholecalciferol (VITAMIN D3) 25 MCG (1000 UT) CAPS Take by mouth daily.    ? cyanocobalamin 1000 MCG tablet Take 1,000 mcg by mouth daily.    ? diphenhydrAMINE (BENADRYL) 25 mg capsule Take 1 capsule (25 mg total) by mouth every 6 (six) hours as needed.    ? escitalopram (LEXAPRO) 5 MG tablet Take 1 tablet (5 mg total) by mouth daily.    ? gabapentin (NEURONTIN) 300 MG capsule Take 300 mg by mouth daily.    ? Multiple Vitamin (MULTIVITAMIN) tablet Take 1 tablet by mouth daily.    ? pravastatin (PRAVACHOL) 10 MG tablet Take 1 tablet (10 mg total) by mouth daily. 90 tablet 3  ? gabapentin (NEURONTIN) 100 MG capsule Take 200 mg by mouth at bedtime. 60 capsule 3  ? phenazopyridine (PYRIDIUM) 200 MG tablet Take 1 tablet (200 mg total) by mouth 3 (three) times daily as needed for pain. 10 tablet 0  ? ?No facility-administered medications prior to visit.  ? ? ?Allergies  ?Allergen Reactions  ? Penicillins   ?  REACTION: rash  ? Shingrix [Zoster Vac Recomb Adjuvanted] Other  (See Comments)  ?  Residual skin sensitivity/heat sensation to R side of body that started after vaccine  ? ? ?ROS ?Review of Systems  ?Constitutional:  Negative for chills and fever.  ?HENT:  Positive for ear discharge (right ear discharge, yellow), ear pain (right), sinus pressure and sinus pain. Negative for congestion and sore throat.   ?Eyes:  Negative for pain, discharge and itching.  ?Respiratory:  Negative for cough, shortness of breath and wheezing.   ?Cardiovascular:  Negative for chest pain and palpitations.  ?Neurological:  Positive for dizziness, weakness and light-headedness. Negative for headaches.  ? ?  ?Objective:  ?  ?Physical Exam ?Constitutional:   ?   General: She is not in acute distress. ?   Appearance: She is obese. She is not ill-appearing, toxic-appearing or diaphoretic.  ?HENT:  ?   Head: Normocephalic.  ?   Right Ear: Drainage (yellow) present. No tenderness. A middle ear effusion (clear) is present. Tympanic membrane is not erythematous.  ?   Left Ear: Tympanic membrane normal. Tympanic membrane is not erythematous.  ?   Nose: Nose normal.  ?   Mouth/Throat:  ?   Mouth: Mucous membranes are moist.  ?Eyes:  ?   Pupils: Pupils are equal, round, and reactive to light.  ?Cardiovascular:  ?   Rate and Rhythm: Normal rate and regular rhythm.  ?Pulmonary:  ?   Effort: Pulmonary effort is normal.  ?   Breath sounds: Normal breath sounds.  ?Musculoskeletal:  ?   Cervical back: Normal range of motion.  ?Neurological:  ?   General: No focal deficit present.  ?   Mental Status: She is alert and oriented to person, place, and time.  ?   Cranial Nerves: No cranial nerve deficit or facial asymmetry.  ?   Sensory: Sensation is intact.  ?   Motor: Motor function is intact.  ?   Coordination: Romberg sign negative. Rapid alternating movements normal.  ?   Gait: Gait is intact. Gait normal.  ?   Comments: Positive dix hallpyke bil  ?Psychiatric:     ?   Mood and Affect: Mood normal.     ?   Behavior:  Behavior normal.     ?   Thought Content: Thought content normal.     ?   Judgment: Judgment normal.  ? ? ?  BP 116/64   Pulse 72   Temp 99 ?F (37.2 ?C)   Resp 16   Ht '5\' 8"'$  (1.727 m)   Wt 197 lb 1 oz (89.4 kg)   SpO2 98%   BMI 29.96 kg/m?  ?Wt Readings from Last 3 Encounters:  ?03/16/22 197 lb 1 oz (89.4 kg)  ?03/02/22 195 lb 3.2 oz (88.5 kg)  ?08/29/21 188 lb (85.3 kg)  ? ? ? ?Health Maintenance Due  ?Topic Date Due  ? HIV Screening  Never done  ? COLONOSCOPY (Pts 45-25yr Insurance coverage will need to be confirmed)  Never done  ? MAMMOGRAM  06/03/2020  ? TETANUS/TDAP  12/26/2021  ? ? ?There are no preventive care reminders to display for this patient. ? ?Lab Results  ?Component Value Date  ? TSH 0.699 02/15/2021  ? ?Lab Results  ?Component Value Date  ? WBC 6.3 08/29/2020  ? HGB 14.8 08/29/2020  ? HCT 46.1 (H) 08/29/2020  ? MCV 89.5 08/29/2020  ? PLT 261 08/29/2020  ? ?Lab Results  ?Component Value Date  ? NA 138 12/31/2020  ? K 4.1 12/31/2020  ? CO2 28 12/31/2020  ? GLUCOSE 91 12/31/2020  ? BUN 8 12/31/2020  ? CREATININE 0.66 12/31/2020  ? BILITOT <0.2 02/15/2021  ? ALKPHOS 143 (H) 02/15/2021  ? AST 13 02/15/2021  ? ALT 21 02/15/2021  ? PROT 6.7 02/15/2021  ? ALBUMIN 4.3 02/15/2021  ? CALCIUM 10.1 12/31/2020  ? ANIONGAP 11 08/29/2020  ? GFR 100.27 12/31/2020  ? ?Lab Results  ?Component Value Date  ? HGBA1C 6.3 12/31/2020  ? ? ?  ?Assessment & Plan:  ? ?Problem List Items Addressed This Visit   ? ?  ? Nervous and Auditory  ? Drainage from right ear  ?  rx ofloxacin 0.3% sent to pharmacy  ?  ?  ? Benign paroxysmal positional vertigo, bilateral  ?  rx meclizine 25 mg prn  ?Rise slow ?Handout given to pt  ?rx steroid for ear fluid ?  ?  ? Relevant Medications  ? meclizine (ANTIVERT) 25 MG tablet  ? Right otitis media with effusion - Primary  ?  rx cefdinir 300 mg  ?Take antibiotic as prescribed. Increase oral fluids. Pt to f/u if sx worsen and or fail to improve in 2-3 days. ? ?  ?  ? Relevant Medications  ?  cefdinir (OMNICEF) 300 MG capsule  ? methylPREDNISolone (MEDROL DOSEPAK) 4 MG TBPK tablet  ?  ? Other  ? Ear discharge of right ear  ?  Total time in office going over acute findings and doing appropriate physical ex

## 2022-03-16 NOTE — Assessment & Plan Note (Signed)
Total time in office going over acute findings and doing appropriate physical exam as well as talking about history was 38 minutes.  ?

## 2022-03-16 NOTE — Patient Instructions (Signed)
Sent in steroid and antibiotic for your ear infection.  ?Also sent in ear drops for your right ear.  ?Meclizine has been sent in as needed for vertigo symptoms.  ? ?It was a pleasure seeing you today! Please do not hesitate to reach out with any questions and or concerns. ? ?Regards,  ? ?Demetress Tift ?FNP-C ? ?

## 2022-05-25 ENCOUNTER — Other Ambulatory Visit: Payer: Self-pay

## 2022-05-25 MED ORDER — PRAVASTATIN SODIUM 10 MG PO TABS: 10.0000 mg | ORAL_TABLET | Freq: Every day | ORAL | 0 refills | Status: DC

## 2022-07-11 ENCOUNTER — Ambulatory Visit: Payer: Managed Care, Other (non HMO) | Admitting: Family

## 2022-07-11 ENCOUNTER — Ambulatory Visit (INDEPENDENT_AMBULATORY_CARE_PROVIDER_SITE_OTHER)
Admission: RE | Admit: 2022-07-11 | Discharge: 2022-07-11 | Disposition: A | Discharge status: Home / Self Care | Payer: Managed Care, Other (non HMO) | Source: Ambulatory Visit | Attending: Family

## 2022-07-11 ENCOUNTER — Encounter: Payer: Self-pay | Admitting: Family

## 2022-07-11 VITALS — BP 122/80 | HR 83 | Temp 98.6°F | Resp 16 | Ht 68.0 in | Wt 200.0 lb

## 2022-07-11 DIAGNOSIS — R0609 Other forms of dyspnea: Secondary | ICD-10-CM

## 2022-07-11 DIAGNOSIS — R0683 Snoring: Secondary | ICD-10-CM

## 2022-07-11 DIAGNOSIS — R5382 Chronic fatigue, unspecified: Secondary | ICD-10-CM | POA: Insufficient documentation

## 2022-07-11 DIAGNOSIS — F17200 Nicotine dependence, unspecified, uncomplicated: Secondary | ICD-10-CM

## 2022-07-11 DIAGNOSIS — Z720 Tobacco use: Secondary | ICD-10-CM

## 2022-07-11 DIAGNOSIS — R739 Hyperglycemia, unspecified: Secondary | ICD-10-CM

## 2022-07-11 NOTE — Assessment & Plan Note (Signed)
Smoking cessation preferred Referring to lung cancer screening for workup/screening

## 2022-07-11 NOTE — Progress Notes (Signed)
Established Patient Office Visit  Subjective:  Patient ID: Barbara Riley, female    DOB: Aug 09, 1966  Age: 56 y.o. MRN: [id number]  CC:  Chief Complaint  Patient presents with   Fatigue    HPI Barbara Riley is here today with concerns.   Fatigue, ongoing.  Covid pneumonia back in 2020, has not felt back since she had pneumonia. She feels she has not truly come back from pneumonia. Denies sob. Just feels chronic fatigue. Worse with DOE, hard to step up the stairs especially over the last two weeks. At the top step she is completely out of energy and completely winded.   Has noticed some ankle edema , right worse than left. Has broken her right ankle in the past.  Wt Readings from Last 3 Encounters:  07/11/22 200 lb (90.7 kg)  03/16/22 197 lb 1 oz (89.4 kg)  03/02/22 195 lb 3.2 oz (88.5 kg)    Easy to fall asleep at work.  Sleeps for about seven hours, then will sleep about three more hours. Doesn't wake up often throughout the day. Does snore, doesn't gasp for breath that she has heard. She needs daily naps.   Denies joint pains.   She is still a smoker, has been since 56 years old so for about 35 years.  Declines depression  Past Medical History:  Diagnosis Date   Anxiety    Stress with toddler and bills   ANXIETY 04/15/2007   Qualifier: Diagnosis of  By: Silvio Pate MD, [facility]    Carpal tunnel syndrome    mild right sided (R median sensory nerve only   Cervical myelopathy ([place]) 08/29/2021   C4-5 level    Past Surgical History:  Procedure Laterality Date   CESAREAN SECTION     HYSTEROSCOPY WITH NOVASURE  08/2019   Dr Matthew Saras at 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    Employer:   Tobacco Use   Smoking status: Former    Packs/day: 0.25    Types: Cigarettes   Smokeless tobacco: Never   Tobacco comments:    4-5 cigarettes daily  Vaping Use   Vaping Use: Never used  Substance and Sexual Activity   Alcohol use: No    Alcohol/week: 0.0 standard drinks of alcohol   Drug use: No   Sexual activity: Yes    Birth control/protection: None  Other Topics Concern   Not on file  Social History Narrative   Caffeine: mtn dew zero occasionally, 2-3 cups of sweet tea at night    Married, lives with husband and son     Occupation: [employer] employee   Activity: no regular exercise, some walking at [employer]  Diet: some water, fruits/vegetables daily, brings lunch from home       Social Determinants of Health   Financial Resource Strain: Not on file  Food Insecurity: Not on file  Transportation Needs: Not on file  Physical Activity: Not on file  Stress: Not on file  Social Connections: Not on file  Intimate Partner Violence: Not on file    Outpatient Medications Prior to Visit  Medication Sig Dispense Refill   calcium citrate-vitamin D (CITRACAL+D) 315-200 MG-UNIT tablet Take 1 tablet by mouth daily.     Cholecalciferol (VITAMIN D3) 25 MCG (1000 UT) CAPS Take by mouth daily.     cyanocobalamin 1000 MCG tablet Take 1,000 mcg by mouth daily.     diphenhydrAMINE (BENADRYL) 25 mg capsule Take 1 capsule (25 mg total) by mouth every 6 (six) hours as needed.     escitalopram (LEXAPRO) 5 MG tablet Take 1 tablet (5 mg total) by mouth daily.     Multiple Vitamin (MULTIVITAMIN) tablet Take 1 tablet by mouth daily.     pravastatin (PRAVACHOL) 10 MG tablet Take 1 tablet (10 mg total) by mouth daily. 90 tablet 0    gabapentin (NEURONTIN) 300 MG capsule Take 300 mg by mouth daily. (Patient not taking: Reported on 07/11/2022)     methylPREDNISolone (MEDROL DOSEPAK) 4 MG TBPK tablet Take per package instructions (Patient not taking: Reported on 07/11/2022) 21 tablet 0   No facility-administered medications prior to visit.    Allergies  Allergen Reactions   Penicillins     REACTION: rash   Shingrix [Zoster Vac Recomb Adjuvanted] Other (See Comments)    Residual skin sensitivity/heat sensation to R side of body that started after vaccine        Objective:    Physical Exam Constitutional:      General: She is not in acute distress.    Appearance: Normal appearance. She is obese. She is not ill-appearing, toxic-appearing or diaphoretic.  Cardiovascular:     Rate and Rhythm: Normal rate and regular rhythm.  Pulmonary:     Effort: Pulmonary effort is normal. No respiratory distress.     Breath sounds: Normal breath sounds. No wheezing or rhonchi.  Abdominal:     General: Abdomen is flat. Bowel sounds are normal.     Tenderness: There is abdominal tenderness (mild right upper quadrant). There is no guarding. Negative signs include Murphy's sign and McBurney's sign.  Musculoskeletal:     Right ankle: Swelling (no pitting h/o injury) present.  Skin:    General: Skin is warm.  Neurological:     General: No focal deficit present.     Mental Status: She is alert and oriented to person, place, and time.  Psychiatric:        Mood and Affect: Mood normal.        Behavior: Behavior normal.        Thought Content: Thought content normal.        Judgment: Judgment normal.     BP 122/80   Pulse 83   Temp 98.6 F (37 C)   Resp 16   Ht '5\' 8"'$  (1.727 m)   Wt 200 lb (90.7 kg)   SpO2 96%   BMI 30.41 kg/m  Wt Readings from Last 3 Encounters:  07/11/22 200 lb (90.7 kg)  03/16/22 197 lb 1 oz (89.4 kg)  03/02/22 195 lb 3.2 oz (88.5 kg)     Health Maintenance Due  Topic Date Due   HIV Screening   Never  done   COLONOSCOPY (Pts 45-46yr Insurance coverage will need to be confirmed)  Never done   MAMMOGRAM  06/03/2020   Zoster Vaccines- Shingrix (2 of 2) 03/18/2021   TETANUS/TDAP  12/26/2021   PAP SMEAR-Modifier  07/28/2022    There are no preventive care reminders to display for this patient.  Lab Results  Component Value Date   TSH 0.699 02/15/2021   Lab Results  Component Value Date   WBC 6.3 08/29/2020   HGB 14.8 08/29/2020   HCT 46.1 (H) 08/29/2020   MCV 89.5 08/29/2020   PLT 261 08/29/2020   Lab Results  Component Value Date   NA 138 12/31/2020   K 4.1 12/31/2020   CO2 28 12/31/2020   GLUCOSE 91 12/31/2020   BUN 8 12/31/2020   CREATININE 0.66 12/31/2020   BILITOT <0.2 02/15/2021   ALKPHOS 143 (H) 02/15/2021   AST 13 02/15/2021   ALT 21 02/15/2021   PROT 6.7 02/15/2021   ALBUMIN 4.3 02/15/2021   CALCIUM 10.1 12/31/2020   ANIONGAP 11 08/29/2020   GFR 100.27 12/31/2020   Lab Results  Component Value Date   HGBA1C 6.3 12/31/2020      Assessment & Plan:   Problem List Items Addressed This Visit       Other   Smoker    Referral to lung cancer screening      Chronic fatigue - Primary    Lab work up pending results      Relevant Orders   Ambulatory referral to Pulmonology   Comprehensive metabolic panel   BE72and Folate Panel   TSH   CBC with Differential/Platelet   Brain natriuretic peptide   Hemoglobin A1c   VITAMIN D 25 Hydroxy (Vit-D Deficiency, Fractures)   Snoring    Referral to pulmonary for sleep study  Possible narcolepsy and or possible sleep apnea        Relevant Orders   Ambulatory referral to Pulmonology   Hyperglycemia    Order a1c  Work on diabetic diet exercise as tolerated      DOE (dyspnea on exertion)    Chest xray today  bnp ordered R/o chf vs acute exacerbation/bronchospasm due to smoking and or pneumonia      Relevant Orders   Comprehensive metabolic panel   CBC with Differential/Platelet   Brain  natriuretic peptide   DG Chest 2 View   Tobacco use    Smoking cessation preferred Referring to lung cancer screening for workup/screening      Relevant Orders   CBC with Differential/Platelet    No orders of the defined types were placed in this encounter.   Follow-up: Return in about 2 weeks (around 07/25/2022) for for follow up with Dr. GDanise Mina    TEugenia Pancoast FNP

## 2022-07-11 NOTE — Assessment & Plan Note (Signed)
Referral to pulmonary for sleep study  Possible narcolepsy and or possible sleep apnea

## 2022-07-11 NOTE — Assessment & Plan Note (Signed)
Referral to lung cancer screening

## 2022-07-11 NOTE — Patient Instructions (Addendum)
Stop by the lab prior to leaving today. I will notify you of your results once received.  Complete xray(s) prior to leaving today. I will notify you of your results once received.  Due to recent changes in healthcare laws, you may see results of your imaging and/or laboratory studies on MyChart before I have had a chance to review them.  I understand that in some cases there may be results that are confusing or concerning to you. Please understand that not all results are received at the same time and often I may need to interpret multiple results in order to provide you with the best plan of care or course of treatment. Therefore, I ask that you please give me 2 business days to thoroughly review all your results before contacting my office for clarification. Should we see a critical lab result, you will be contacted sooner.   It was a pleasure seeing you today! Please do not hesitate to reach out with any questions and or concerns.  Regards,   Eugenia Pancoast FNP-C

## 2022-07-11 NOTE — Assessment & Plan Note (Signed)
Chest xray today  bnp ordered R/o chf vs acute exacerbation/bronchospasm due to smoking and or pneumonia

## 2022-07-11 NOTE — Assessment & Plan Note (Signed)
Lab work up pending results

## 2022-07-11 NOTE — Assessment & Plan Note (Signed)
Order a1c  Work on diabetic diet exercise as tolerated

## 2022-07-12 ENCOUNTER — Telehealth: Payer: Self-pay | Admitting: Radiology

## 2022-07-12 ENCOUNTER — Other Ambulatory Visit: Payer: Self-pay | Admitting: Family

## 2022-07-12 DIAGNOSIS — R748 Abnormal levels of other serum enzymes: Secondary | ICD-10-CM

## 2022-07-12 LAB — TSH: TSH: 0.722 u[IU]/mL (ref 0.450–4.500)

## 2022-07-12 LAB — BRAIN NATRIURETIC PEPTIDE: BNP: 20.6 pg/mL (ref 0.0–100.0)

## 2022-07-12 LAB — CBC WITH DIFFERENTIAL/PLATELET
Basophils Absolute: 0 10*3/uL (ref 0.0–0.2)
Basos: 0 %
EOS (ABSOLUTE): 0.1 10*3/uL (ref 0.0–0.4)
Eos: 1 %
Hematocrit: 41.4 % (ref 34.0–46.6)
Hemoglobin: 13.7 g/dL (ref 11.1–15.9)
Immature Grans (Abs): 0 10*3/uL (ref 0.0–0.1)
Immature Granulocytes: 0 %
Lymphocytes Absolute: 2.5 10*3/uL (ref 0.7–3.1)
Lymphs: 24 %
MCH: 29.8 pg (ref 26.6–33.0)
MCHC: 33.1 g/dL (ref 31.5–35.7)
MCV: 90 fL (ref 79–97)
Monocytes Absolute: 0.8 10*3/uL (ref 0.1–0.9)
Monocytes: 7 %
Neutrophils Absolute: 7 10*3/uL (ref 1.4–7.0)
Neutrophils: 68 %
Platelets: 318 10*3/uL (ref 150–450)
RBC: 4.6 x10E6/uL (ref 3.77–5.28)
RDW: 13.5 % (ref 11.7–15.4)
WBC: 10.4 10*3/uL (ref 3.4–10.8)

## 2022-07-12 LAB — COMPREHENSIVE METABOLIC PANEL
ALT: 15 IU/L (ref 0–32)
AST: 15 IU/L (ref 0–40)
Albumin/Globulin Ratio: 1.6 (ref 1.2–2.2)
Albumin: 4.1 g/dL (ref 3.8–4.9)
Alkaline Phosphatase: 148 IU/L — ABNORMAL HIGH (ref 44–121)
BUN/Creatinine Ratio: 11 (ref 9–23)
BUN: 8 mg/dL (ref 6–24)
Bilirubin Total: 0.2 mg/dL (ref 0.0–1.2)
CO2: 22 mmol/L (ref 20–29)
Calcium: 9.5 mg/dL (ref 8.7–10.2)
Chloride: 104 mmol/L (ref 96–106)
Creatinine, Ser: 0.72 mg/dL (ref 0.57–1.00)
Globulin, Total: 2.5 g/dL (ref 1.5–4.5)
Glucose: 92 mg/dL (ref 70–99)
Potassium: 4.1 mmol/L (ref 3.5–5.2)
Sodium: 141 mmol/L (ref 134–144)
Total Protein: 6.6 g/dL (ref 6.0–8.5)
eGFR: 99 mL/min/{1.73_m2} (ref >59)

## 2022-07-12 LAB — HEMOGLOBIN A1C
Est. average glucose Bld gHb Est-mCnc: 126 mg/dL
Hgb A1c MFr Bld: 6 % — ABNORMAL HIGH (ref 4.8–5.6)

## 2022-07-12 LAB — B12 AND FOLATE PANEL
Folate: 15.1 ng/mL (ref >3.0)
Vitamin B-12: 572 pg/mL (ref 232–1245)

## 2022-07-12 LAB — VITAMIN D 25 HYDROXY (VIT D DEFICIENCY, FRACTURES): Vit D, 25-Hydroxy: 26.7 ng/mL — ABNORMAL LOW (ref 30.0–100.0)

## 2022-07-12 NOTE — Progress Notes (Signed)
Can we add the two orders I placed as future?  Gamma and nucleotidase? If pt needs to get special draw for this please get her in.

## 2022-07-12 NOTE — Telephone Encounter (Signed)
Lvm for patient to schedule a lab appt per Dugal's request

## 2022-07-14 ENCOUNTER — Other Ambulatory Visit: Payer: Self-pay | Admitting: Family

## 2022-07-14 NOTE — Progress Notes (Unsigned)
A1c

## 2022-07-17 ENCOUNTER — Other Ambulatory Visit: Payer: Self-pay | Admitting: Family

## 2022-07-17 DIAGNOSIS — E559 Vitamin D deficiency, unspecified: Secondary | ICD-10-CM

## 2022-07-17 MED ORDER — VITAMIN D (ERGOCALCIFEROL) 1.25 MG (50000 UNIT) PO CAPS: 50000.0000 [IU] | ORAL_CAPSULE | ORAL | 0 refills | Status: DC

## 2022-07-17 NOTE — Progress Notes (Signed)
Pt with chronic elevation of alkaline phos which is a liver function test I want to order one more blood test, can we have pt come in for lab only to complete this? Unable to add.  Vitamin d very low sending in RX dosing then when done take 2000 IU once daily.  Hga1c with prediabetes. Work on [place] good

## 2022-07-21 ENCOUNTER — Other Ambulatory Visit (INDEPENDENT_AMBULATORY_CARE_PROVIDER_SITE_OTHER): Payer: Managed Care, Other (non HMO)

## 2022-07-21 DIAGNOSIS — R748 Abnormal levels of other serum enzymes: Secondary | ICD-10-CM | POA: Diagnosis not present

## 2022-07-24 LAB — NUCLEOTIDASE, 5', BLOOD: 5-Nucleotidase: 4 IU/L (ref 0–11)

## 2022-07-24 LAB — GAMMA GT: GGT: 17 IU/L (ref 0–60)

## 2022-07-25 ENCOUNTER — Ambulatory Visit: Payer: Managed Care, Other (non HMO) | Admitting: Family Medicine

## 2022-07-25 ENCOUNTER — Encounter: Payer: Self-pay | Admitting: Family Medicine

## 2022-07-25 VITALS — BP 134/80 | HR 78 | Temp 98.0°F | Ht 68.0 in | Wt 195.5 lb

## 2022-07-25 DIAGNOSIS — M5000 Cervical disc disorder with myelopathy, unspecified cervical region: Secondary | ICD-10-CM

## 2022-07-25 DIAGNOSIS — R5382 Chronic fatigue, unspecified: Secondary | ICD-10-CM | POA: Diagnosis not present

## 2022-07-25 DIAGNOSIS — R748 Abnormal levels of other serum enzymes: Secondary | ICD-10-CM

## 2022-07-25 DIAGNOSIS — R0683 Snoring: Secondary | ICD-10-CM | POA: Diagnosis not present

## 2022-07-25 DIAGNOSIS — M48061 Spinal stenosis, lumbar region without neurogenic claudication: Secondary | ICD-10-CM

## 2022-07-25 NOTE — Progress Notes (Unsigned)
Patient ID: Barbara Riley, female    DOB: 1966/03/08, 56 y.o.   MRN: [id number]  This visit was conducted in person.  BP 134/80   Pulse 78   Temp 98 F (36.7 C) (Temporal)   Ht 5' 8" (1.727 m)   Wt 195 lb 8 oz (88.7 kg)   SpO2 97%   BMI 29.73 kg/m    CC: 2 wk f/u visit  Subjective:   HPI: Barbara Riley is a 56 y.o. female presenting on 07/25/2022 for Follow-up (Here for 2 wk f/u, per [place]. )   Saw Tabitha last week, note reviewed.  Ongoing fatigue since COVID pneumonia back in 2020.  Lab evaluation revealed elevated ALP, but normal LFTs otherwise including GGT. BNP, vit B12, CBC, TSH WNL.  Vit D mildly low at 26. This was despite vitamin D3 2000 units daily since she was taking depo shot (off depo provera 2020). A1c in prediabetes range.   Started vitamin D 50k units weekly replacement.   Notes significant daytime sleepiness. Easily falls asleep at work. Feels rested after awakening. No morning headache. She is loud snorer. No witnessed apneic episodes, no PNdyspnea.  Has been referred to pulm for further evaluation of sleep apnea/narcolepsy.   She's been receiving epidural steroid injections into lumbar spine.  Notes ongoing warmth to skin and tingling of R lower abdomen as well as R foot and ankle.      Relevant past medical, surgical, family and social history reviewed and updated as indicated. Interim medical history since our last visit reviewed. Allergies and medications reviewed and updated. Outpatient Medications Prior to Visit  Medication Sig Dispense Refill   calcium citrate-vitamin D (CITRACAL+D) 315-200 MG-UNIT tablet Take 1 tablet by mouth daily.     Cholecalciferol (VITAMIN D3) 25 MCG (1000 UT) CAPS Take by mouth daily.     cyanocobalamin 1000 MCG tablet Take 1,000 mcg by mouth daily.     diphenhydrAMINE (BENADRYL) 25 mg capsule Take 1 capsule (25 mg total) by mouth every 6 (six) hours as needed.     escitalopram (LEXAPRO) 5 MG tablet Take 1 tablet (5 mg  total) by mouth daily.     gabapentin (NEURONTIN) 300 MG capsule Take 300 mg by mouth daily. (Patient not taking: Reported on 07/11/2022)     Multiple Vitamin (MULTIVITAMIN) tablet Take 1 tablet by mouth daily.     pravastatin (PRAVACHOL) 10 MG tablet Take 1 tablet (10 mg total) by mouth daily. 90 tablet 0   Vitamin D, Ergocalciferol, (DRISDOL) 1.25 MG (50000 UNIT) CAPS capsule Take 1 capsule (50,000 Units total) by mouth every 7 (seven) days for 8 doses. 8 capsule 0   No facility-administered medications prior to visit.     Per HPI unless specifically indicated in ROS section below Review of Systems  Objective:  BP 134/80   Pulse 78   Temp 98 F (36.7 C) (Temporal)   Ht 5' 8" (1.727 m)   Wt 195 lb 8 oz (88.7 kg)   SpO2 97%   BMI 29.73 kg/m   Wt Readings from Last 3 Encounters:  07/25/22 195 lb 8 oz (88.7 kg)  07/11/22 200 lb (90.7 kg)  03/16/22 197 lb 1 oz (89.4 kg)      Physical Exam Vitals and nursing note reviewed.  Constitutional:      Appearance: Normal appearance. She is not ill-appearing.  HENT:     Head: Normocephalic and atraumatic.     Mouth/Throat:     Mouth:  Mucous membranes are moist.     Pharynx: Oropharynx is clear. No oropharyngeal exudate or posterior oropharyngeal erythema.  Eyes:     Extraocular Movements: Extraocular movements intact.     Conjunctiva/sclera: Conjunctivae normal.     Pupils: Pupils are equal, round, and reactive to light.  Cardiovascular:     Rate and Rhythm: Normal rate and regular rhythm.     Pulses: Normal pulses.     Heart sounds: Normal heart sounds. No murmur heard. Pulmonary:     Effort: Pulmonary effort is normal. No respiratory distress.     Breath sounds: Normal breath sounds. No wheezing, rhonchi or rales.  Abdominal:     General: Bowel sounds are normal. There is no distension.     Palpations: Abdomen is soft. There is no mass.     Tenderness: There is abdominal tenderness (mild) in the right upper quadrant. There is  no right CVA tenderness, left CVA tenderness, guarding or rebound. Negative signs include Murphy's sign.     Hernia: No hernia is present.  Musculoskeletal:     Right lower leg: No edema.     Left lower leg: No edema.  Skin:    General: Skin is warm and dry.     Findings: No rash.  Neurological:     Mental Status: She is alert.  Psychiatric:        Mood and Affect: Mood normal.        Behavior: Behavior normal.       Results for orders placed or performed in visit on 07/21/22  Gamma GT  Result Value Ref Range   GGT 17 0 - 60 IU/L  Nucleotidase, 5', blood  Result Value Ref Range   5-Nucleotidase 4 0 - 11 IU/L   Lab Results  Component Value Date   HGBA1C 6.0 (H) 07/11/2022    Lab Results  Component Value Date   ALT 15 07/11/2022   AST 15 07/11/2022   GGT 17 07/21/2022   ALKPHOS 148 (H) 07/11/2022   BILITOT 0.2 07/11/2022    Assessment & Plan:  She will return for physical.   Problem List Items Addressed This Visit     Elevated alkaline phosphatase level    Chronic. Recent GGT, 5-NT normal. Update fractionated ALP isoenzymes - previously normal 02/2021. Consider RUQ [place] with ntoed discomfort on exam.       Relevant Orders   US ABDOMEN LIMITED RUQ (LIVER/GB)   Cervical disc disorder with myelopathy    S/p ACDF 05/2021 with benefit.  Wonder if some of her R abdominal paresthesias could be residual from this.       Lumbar spinal stenosis    Seeing neurosurgery s/p ESI x3 2023      Chronic fatigue - Primary    Present since COVID PNA 2020.  Recent labwork largely reassuring, with mildly low vitamin D s/p replacement started, notes some benefit with this.  She has upcoming evaluation for OSA which could contribute to symptoms.       Snoring    ESS = 10.  Encouraged she call and schedule pulm eval for possible sleep apnea.         No orders of the defined types were placed in this encounter.  Orders Placed This Encounter  Procedures   US ABDOMEN LIMITED  RUQ (LIVER/GB)    Standing Status:   Future    Standing Expiration Date:   07/27/2023    Order Specific Question:   Reason for Exam (SYMPTOM  OR DIAGNOSIS REQUIRED)    Answer:   elevated alk phos    Order Specific Question:   Preferred imaging location?    Answer:   [facility]     Patient Instructions  We will add further alkaline phosphatase testing to blood in lab and be in touch with results.  Schedule appointment with lung doctors for evaluation of sleep apnea Continue weekly vitamin D replacement.  Return at your convenience for physical.   Follow up plan: Return if symptoms worsen or fail to improve.  [name], MD

## 2022-07-25 NOTE — Patient Instructions (Addendum)
We will add further alkaline phosphatase testing to blood in lab and be in touch with results.  Schedule appointment with lung doctors for evaluation of sleep apnea Continue weekly vitamin D replacement.  Return at your convenience for physical.

## 2022-07-26 ENCOUNTER — Encounter: Payer: Self-pay | Admitting: Family Medicine

## 2022-07-26 NOTE — Assessment & Plan Note (Signed)
S/p ACDF 05/2021 with benefit.  Wonder if some of her R abdominal paresthesias could be residual from this.

## 2022-07-26 NOTE — Assessment & Plan Note (Signed)
Chronic. Recent GGT, 5-NT normal. Update fractionated ALP isoenzymes - previously normal 02/2021. Consider RUQ [place] with ntoed discomfort on exam.

## 2022-07-26 NOTE — Assessment & Plan Note (Signed)
Seeing neurosurgery s/p ESI x3 2023

## 2022-07-26 NOTE — Assessment & Plan Note (Signed)
ESS = 10.  Encouraged she call and schedule pulm eval for possible sleep apnea.

## 2022-07-26 NOTE — Assessment & Plan Note (Signed)
Present since COVID PNA 2020.  Recent labwork largely reassuring, with mildly low vitamin D s/p replacement started, notes some benefit with this.  She has upcoming evaluation for OSA which could contribute to symptoms.

## 2022-07-27 LAB — ALKALINE PHOSPHATASE, ISOENZYMES
Alkaline Phosphatase: 148 IU/L — ABNORMAL HIGH (ref 44–121)
BONE FRACTION: 32 % (ref 14–68)
INTESTINAL FRAC.: 6 % (ref 0–18)
LIVER FRACTION: 62 % (ref 18–85)

## 2022-07-27 LAB — SPECIMEN STATUS REPORT

## 2022-08-03 ENCOUNTER — Telehealth: Payer: Self-pay | Admitting: Family Medicine

## 2022-08-03 NOTE — Telephone Encounter (Signed)
Patient called in stating that the vitamin D rx Vitamin D, Ergocalciferol, (DRISDOL) 1.25 MG (50000 UNIT) CAPS capsulethat she was prescribed only worked for 2 days for her and now she back feeling lifeless again.She would like a phone call to let her know if there are other options that will give her more energy?

## 2022-08-04 ENCOUNTER — Other Ambulatory Visit: Payer: Self-pay | Admitting: Family Medicine

## 2022-08-04 MED ORDER — VITAMIN D3 1.25 MG (50000 UT) PO TABS: 1.0000 | ORAL_TABLET | ORAL | 0 refills | Status: DC

## 2022-08-04 NOTE — Telephone Encounter (Signed)
Left message on vm per dpr relaying Dr. G's message.  

## 2022-08-04 NOTE — Telephone Encounter (Addendum)
I'd like her to try cholecalciferol (D3) 50,000 units weekly to take in place of the ergocalciferol (D2) as D3 is better absorbed. I've sent this in to her pharmacy.

## 2022-08-04 NOTE — Addendum Note (Signed)
Addended by: [name] on: 08/04/2022 04:40 PM   Modules accepted: Orders

## 2022-08-07 NOTE — Telephone Encounter (Signed)
Lvm asking pt to confirm that she got Dr. Synthia Innocent message I left on her vm on Fri, 08/04/22.

## 2022-08-07 NOTE — Telephone Encounter (Signed)
Sent 90-day rx,per pharmacy request.

## 2022-08-07 NOTE — Telephone Encounter (Signed)
Patient returned called she received. Stated she did get the vm and that she picked up the prescription today. Stated if something else is needed to just give her a call back. Thank you!

## 2022-08-09 ENCOUNTER — Other Ambulatory Visit: Payer: Managed Care, Other (non HMO)

## 2022-08-11 LAB — LIPID PANEL
Cholesterol: 189 (ref 0–200)
HDL: 46 (ref 35–70)
LDL Cholesterol: 121
Triglycerides: 120 (ref 40–160)

## 2022-08-11 LAB — HEMOGLOBIN A1C: Hemoglobin A1C: 6.1

## 2022-08-11 LAB — BASIC METABOLIC PANEL: Creatinine: 0.7 (ref 0.5–1.1)

## 2022-08-14 ENCOUNTER — Other Ambulatory Visit: Payer: Self-pay | Admitting: Family Medicine

## 2022-08-15 NOTE — Telephone Encounter (Signed)
Noted  

## 2022-08-15 NOTE — Telephone Encounter (Signed)
E-scribed refill.  Plz schedule CPE and lab visits.

## 2022-08-15 NOTE — Telephone Encounter (Signed)
LVM for patient to return call and get scheduled.

## 2022-08-16 ENCOUNTER — Ambulatory Visit
Admission: RE | Admit: 2022-08-16 | Discharge: 2022-08-16 | Disposition: A | Discharge status: Home / Self Care | Payer: Managed Care, Other (non HMO) | Source: Ambulatory Visit | Attending: Family Medicine | Admitting: Family Medicine

## 2022-08-16 DIAGNOSIS — R748 Abnormal levels of other serum enzymes: Secondary | ICD-10-CM | POA: Diagnosis present

## 2022-08-17 ENCOUNTER — Encounter: Payer: Self-pay | Admitting: Family Medicine

## 2022-08-17 DIAGNOSIS — K760 Fatty (change of) liver, not elsewhere classified: Secondary | ICD-10-CM | POA: Insufficient documentation

## 2022-08-30 ENCOUNTER — Other Ambulatory Visit: Payer: Managed Care, Other (non HMO)

## 2022-08-30 ENCOUNTER — Other Ambulatory Visit: Payer: Self-pay | Admitting: Family Medicine

## 2022-08-30 DIAGNOSIS — E785 Hyperlipidemia, unspecified: Secondary | ICD-10-CM

## 2022-08-30 DIAGNOSIS — K760 Fatty (change of) liver, not elsewhere classified: Secondary | ICD-10-CM

## 2022-08-30 DIAGNOSIS — R748 Abnormal levels of other serum enzymes: Secondary | ICD-10-CM

## 2022-08-30 DIAGNOSIS — E559 Vitamin D deficiency, unspecified: Secondary | ICD-10-CM | POA: Insufficient documentation

## 2022-08-30 DIAGNOSIS — R7303 Prediabetes: Secondary | ICD-10-CM

## 2022-09-04 ENCOUNTER — Encounter: Payer: Self-pay | Admitting: Family Medicine

## 2022-09-04 ENCOUNTER — Ambulatory Visit (INDEPENDENT_AMBULATORY_CARE_PROVIDER_SITE_OTHER): Payer: Managed Care, Other (non HMO) | Admitting: Family Medicine

## 2022-09-04 VITALS — BP 124/76 | HR 88 | Temp 98.0°F | Ht 67.75 in | Wt 194.5 lb

## 2022-09-04 DIAGNOSIS — E785 Hyperlipidemia, unspecified: Secondary | ICD-10-CM

## 2022-09-04 DIAGNOSIS — K760 Fatty (change of) liver, not elsewhere classified: Secondary | ICD-10-CM

## 2022-09-04 DIAGNOSIS — E559 Vitamin D deficiency, unspecified: Secondary | ICD-10-CM | POA: Diagnosis not present

## 2022-09-04 DIAGNOSIS — Z0000 Encounter for general adult medical examination without abnormal findings: Secondary | ICD-10-CM

## 2022-09-04 DIAGNOSIS — R5382 Chronic fatigue, unspecified: Secondary | ICD-10-CM

## 2022-09-04 DIAGNOSIS — E669 Obesity, unspecified: Secondary | ICD-10-CM

## 2022-09-04 DIAGNOSIS — R748 Abnormal levels of other serum enzymes: Secondary | ICD-10-CM | POA: Diagnosis not present

## 2022-09-04 DIAGNOSIS — F4320 Adjustment disorder, unspecified: Secondary | ICD-10-CM

## 2022-09-04 DIAGNOSIS — Z1211 Encounter for screening for malignant neoplasm of colon: Secondary | ICD-10-CM

## 2022-09-04 DIAGNOSIS — R7303 Prediabetes: Secondary | ICD-10-CM

## 2022-09-04 DIAGNOSIS — F17200 Nicotine dependence, unspecified, uncomplicated: Secondary | ICD-10-CM

## 2022-09-04 MED ORDER — ATORVASTATIN CALCIUM 20 MG PO TABS: 20.0000 mg | ORAL_TABLET | Freq: Every day | ORAL | 3 refills | Status: DC

## 2022-09-04 NOTE — Assessment & Plan Note (Signed)
Fatigue improving on current vitamin D regimen.

## 2022-09-04 NOTE — Assessment & Plan Note (Signed)
Ccurrenly on vitamin D replacement -50k units weekly with 2000 IU daily.  Update vit D levels.

## 2022-09-04 NOTE — Assessment & Plan Note (Addendum)
Continue to avoid added sugars/sweetened beverages.  Reviewed labs from work - A1c 6.1% Update fructosamine today.

## 2022-09-04 NOTE — Assessment & Plan Note (Signed)
Preventative protocols reviewed and updated unless pt declined. Discussed healthy diet and lifestyle.  

## 2022-09-04 NOTE — Assessment & Plan Note (Addendum)
Encouraged full smoking cessation.  Not eligible for lung cancer screening program (17.5 PY hx)

## 2022-09-04 NOTE — Patient Instructions (Addendum)
We will request latest mammogram from [facility] (Dr Matthew Saras).  Start atorvastatin 16m in place of pravastatin.  Pass by lab to pick up stool kit.  Labs today.  Good to see you today.  Return in 6 months for labs and in 1 year for next physical.   Health Maintenance for Postmenopausal Women Menopause is a normal process in which your ability to get pregnant comes to an end. This process happens slowly over many months or years, usually between the ages of 411and 556 Menopause is complete when you have missed your menstrual period for 12 months. It is important to talk with your health care provider about some of the most common conditions that affect women after menopause (postmenopausal women). These include heart disease, cancer, and bone loss (osteoporosis). Adopting a healthy lifestyle and getting preventive care can help to promote your health and wellness. The actions you take can also lower your chances of developing some of these common conditions. What are the signs and symptoms of menopause? During menopause, you may have the following symptoms: Hot flashes. These can be moderate or severe. Night sweats. Decrease in sex drive. Mood swings. Headaches. Tiredness (fatigue). Irritability. Memory problems. Problems falling asleep or staying asleep. Talk with your health care provider about treatment options for your symptoms. Do I need hormone replacement therapy? Hormone replacement therapy is effective in treating symptoms that are caused by menopause, such as hot flashes and night sweats. Hormone replacement carries certain risks, especially as you become older. If you are thinking about using estrogen or estrogen with progestin, discuss the benefits and risks with your health care provider. How can I reduce my risk for heart disease and stroke? The risk of heart disease, heart attack, and stroke increases as you age. One of the causes may be a change in the body's  hormones during menopause. This can affect how your body uses dietary fats, triglycerides, and cholesterol. Heart attack and stroke are medical emergencies. There are many things that you can do to help prevent heart disease and stroke. Watch your blood pressure High blood pressure causes heart disease and increases the risk of stroke. This is more likely to develop in people who have high blood pressure readings or are overweight. Have your blood pressure checked: Every 3-5 years if you are 15889years of age. Every year if you are 430years old or older. Eat a healthy diet  Eat a diet that includes plenty of vegetables, fruits, low-fat dairy products, and lean protein. Do not eat a lot of foods that are high in solid fats, added sugars, or sodium. Get regular exercise Get regular exercise. This is one of the most important things you can do for your health. Most adults should: Try to exercise for at least 150 minutes each week. The exercise should increase your heart rate and make you sweat (moderate-intensity exercise). Try to do strengthening exercises at least twice each week. Do these in addition to the moderate-intensity exercise. Spend less time sitting. Even light physical activity can be beneficial. Other tips Work with your health care provider to achieve or maintain a healthy weight. Do not use any products that contain nicotine or tobacco. These products include cigarettes, chewing tobacco, and vaping devices, such as e-cigarettes. If you need help quitting, ask your health care provider. Know your numbers. Ask your health care provider to check your cholesterol and your blood sugar (glucose). Continue to have your blood tested as directed by your health  care provider. Do I need screening for cancer? Depending on your health history and family history, you may need to have cancer screenings at different stages of your life. This may include screening for: Breast cancer. Cervical  cancer. Lung cancer. Colorectal cancer. What is my risk for osteoporosis? After menopause, you may be at increased risk for osteoporosis. Osteoporosis is a condition in which bone destruction happens more quickly than new bone creation. To help prevent osteoporosis or the bone fractures that can happen because of osteoporosis, you may take the following actions: If you are 51-74 years old, get at least 1,000 mg of calcium and at least 600 international units (IU) of vitamin D per day. If you are older than age 49 but younger than age 27, get at least 1,200 mg of calcium and at least 600 international units (IU) of vitamin D per day. If you are older than age 5, get at least 1,200 mg of calcium and at least 800 international units (IU) of vitamin D per day. Smoking and drinking excessive alcohol increase the risk of osteoporosis. Eat foods that are rich in calcium and vitamin D, and do weight-bearing exercises several times each week as directed by your health care provider. How does menopause affect my mental health? Depression may occur at any age, but it is more common as you become older. Common symptoms of depression include: Feeling depressed. Changes in sleep patterns. Changes in appetite or eating patterns. Feeling an overall lack of motivation or enjoyment of activities that you previously enjoyed. Frequent crying spells. Talk with your health care provider if you think that you are experiencing any of these symptoms. General instructions See your health care provider for regular wellness exams and vaccines. This may include: Scheduling regular health, dental, and eye exams. Getting and maintaining your vaccines. These include: Influenza vaccine. Get this vaccine each year before the flu season begins. Pneumonia vaccine. Shingles vaccine. Tetanus, diphtheria, and pertussis (Tdap) booster vaccine. Your health care provider may also recommend other immunizations. Tell your health  care provider if you have ever been abused or do not feel safe at home. Summary Menopause is a normal process in which your ability to get pregnant comes to an end. This condition causes hot flashes, night sweats, decreased interest in sex, mood swings, headaches, or lack of sleep. Treatment for this condition may include hormone replacement therapy. Take actions to keep yourself healthy, including exercising regularly, eating a healthy diet, watching your weight, and checking your blood pressure and blood sugar levels. Get screened for cancer and depression. Make sure that you are up to date with all your vaccines. This information is not intended to replace advice given to you by your health care provider. Make sure you discuss any questions you have with your health care provider. Document Revised: 04/25/2021 Document Reviewed: 04/25/2021 Elsevier Patient Education  [place].

## 2022-09-04 NOTE — Assessment & Plan Note (Addendum)
Chronic, overall unchanged readings since starting pravastatin daily 2019. She does have fmhx CAD. With new fatty liver noted, will work towards better cholesterol control - start atorvastatin '20mg'$  in place of pravastatin daily. RTC 6 mo lab visit only.  The 10-year ASCVD risk score (Arnett DK, et al., 2019) is: 2%   Values used to calculate the score:     Age: 56 years     Sex: Female     Is Non-Hispanic African American: No     Diabetic: No     Tobacco smoker: No     Systolic Blood Pressure: 222 mmHg     Is BP treated: No     HDL Cholesterol: 46.2 mg/dL     Total Cholesterol: 199 mg/dL

## 2022-09-04 NOTE — Assessment & Plan Note (Signed)
Stable period on low dose lexapro '5mg'$ 

## 2022-09-04 NOTE — Assessment & Plan Note (Addendum)
Discussed fatty liver diagnosis. rec cholesterol, sugar and weight control.  Discussed Fib-4 score for NAFLD - low fibrosis risk.    Fibrosis 4 Score = .66 (Low risk)       Interpretation for patients with NAFLD          <1.30       -  F0-F1 (Low risk)          1.30-2.67 -  Indeterminate           >2.67      -  F3-F4 (High risk)     Validated for ages 29-65

## 2022-09-04 NOTE — Assessment & Plan Note (Signed)
Normal fractionated percentages this year. RUQ US showing fatty liver changes 07/2022. Will conitnue to monitor this.

## 2022-09-04 NOTE — Assessment & Plan Note (Signed)
Congratulated on weight loss to date - she has been working on this. Encouraged continue healthy diet and lifestyle choices to affect sustainable weight loss.

## 2022-09-04 NOTE — Progress Notes (Unsigned)
Patient ID: Barbara Riley, female    DOB: Apr 28, 1966, 56 y.o.   MRN: [id number]  This visit was conducted in person.  BP 124/76   Pulse 88   Temp 98 F (36.7 C) (Temporal)   Ht 5' 7.75" (1.721 m)   Wt 194 lb 8 oz (88.2 kg)   SpO2 97%   BMI 29.79 kg/m    CC: CPE Subjective:   HPI: Barbara Riley is a 56 y.o. female presenting on 09/04/2022 for Annual Exam   08/2020 - CAP treated with levaquin and albuterol inhaler PRN.   Fatigue - labwork showed low vitamin D levels so weekly vit D3 was started mid 07/2022. She didn't feel once weekly was effective so has been taking 2000 IU daily in addition. Feels better on this regimen.   HLD - on pravastatin 53m daily since 2019.  Strong paternal fmhx CAD/CVA (father, grandparents), brother age 2963with CAD.   Preventative: Colon cancer screening - discussed options - will start with [facility] scheduled - s/p D&C and ablation for heavy bleeding after stopped birth control. Prior on depo shots 10+ yrs.  LMP - unsure ~08/2019  Mammogram with GYN yearly - will request records Lung cancer screening - not eligible  Flu shot - declines [place], 08/2020, booster Tdap 2013  Shingrix - 01/2021 - bad reaction to it so will not continue  Seat belt use discussed  Sunscreen use discussed. No changing moles. Smoking - 1/2 ppd, started age 56yo.  Alcohol - none  Dentist - saw last week  Eye exam yearly    Caffeine: 1-2 cups tea/day Married, lives with husband and son   Occupation: [employer] employee Activity: no regular exercise  Diet: some water, fruits/vegetables daily, brings lunch from home, no fish     Relevant past medical, surgical, family and social history reviewed and updated as indicated. Interim medical history since our last visit reviewed. Allergies and medications reviewed and updated. Outpatient Medications Prior to Visit  Medication Sig  Dispense Refill   calcium citrate-vitamin D (CITRACAL+D) 315-200 MG-UNIT tablet Take 1 tablet by mouth daily.     Cholecalciferol (VITAMIN D3) 1.25 MG (50000 UT) CAPS TAKE 1 CAPSULE BY MOUTH ONCE A WEEK 12 capsule 0   cyanocobalamin 1000 MCG tablet Take 1,000 mcg by mouth daily.     diphenhydrAMINE (BENADRYL) 25 mg capsule Take 1 capsule (25 mg total) by mouth every 6 (six) hours as needed.     escitalopram (LEXAPRO) 5 MG tablet Take 1 tablet (5 mg total) by mouth daily.     Multiple Vitamin (MULTIVITAMIN) tablet Take 1 tablet by mouth daily.     pravastatin (PRAVACHOL) 10 MG tablet TAKE 1 TABLET(10 MG) BY MOUTH DAILY 90 tablet 0   gabapentin (NEURONTIN) 300 MG capsule Take 300 mg by mouth daily. (Patient not taking: Reported on 07/11/2022)     No facility-administered medications prior to visit.     Per HPI unless specifically indicated in ROS section below Review of Systems  Constitutional:  Negative for activity change, appetite change, chills, fatigue, fever and unexpected weight change.  HENT:  Negative for hearing loss.   Eyes:  Negative for visual disturbance.  Respiratory:  Negative for cough, chest tightness, shortness of breath and wheezing.   Cardiovascular:  Negative for chest pain, palpitations and leg swelling.  Gastrointestinal:  Positive for constipation (diet related). Negative for abdominal  distention, abdominal pain, blood in stool, diarrhea, nausea and vomiting.  Genitourinary:  Negative for difficulty urinating and hematuria.  Musculoskeletal:  Negative for arthralgias, myalgias and neck pain.  Skin:  Negative for rash.  Neurological:  Positive for dizziness (occ) and headaches (occ). Negative for seizures and syncope.       Warm skin sensation to right side of body  Hematological:  Negative for adenopathy. Does not bruise/bleed easily.  Psychiatric/Behavioral:  Negative for dysphoric mood. The patient is not nervous/anxious.     Objective:  BP 124/76   Pulse 88    Temp 98 F (36.7 C) (Temporal)   Ht 5' 7.75" (1.721 m)   Wt 194 lb 8 oz (88.2 kg)   SpO2 97%   BMI 29.79 kg/m   Wt Readings from Last 3 Encounters:  09/04/22 194 lb 8 oz (88.2 kg)  07/25/22 195 lb 8 oz (88.7 kg)  07/11/22 200 lb (90.7 kg)      Physical Exam Vitals and nursing note reviewed.  Constitutional:      Appearance: Normal appearance. She is not ill-appearing.  HENT:     Head: Normocephalic and atraumatic.     Right Ear: Tympanic membrane, ear canal and external ear normal. There is no impacted cerumen.     Left Ear: Tympanic membrane, ear canal and external ear normal. There is no impacted cerumen.  Eyes:     General:        Right eye: No discharge.        Left eye: No discharge.     Extraocular Movements: Extraocular movements intact.     Conjunctiva/sclera: Conjunctivae normal.     Pupils: Pupils are equal, round, and reactive to light.  Neck:     Thyroid: No thyroid mass or thyromegaly.  Cardiovascular:     Rate and Rhythm: Normal rate and regular rhythm.     Pulses: Normal pulses.     Heart sounds: Normal heart sounds. No murmur heard. Pulmonary:     Effort: Pulmonary effort is normal. No respiratory distress.     Breath sounds: Normal breath sounds. No wheezing, rhonchi or rales.  Abdominal:     General: Bowel sounds are normal. There is no distension.     Palpations: Abdomen is soft. There is no mass.     Tenderness: There is no abdominal tenderness. There is no guarding or rebound.     Hernia: No hernia is present.  Musculoskeletal:     Cervical back: Normal range of motion and neck supple. No rigidity.     Right lower leg: No edema.     Left lower leg: No edema.  Lymphadenopathy:     Cervical: No cervical adenopathy.  Skin:    General: Skin is warm and dry.     Findings: No rash.  Neurological:     General: No focal deficit present.     Mental Status: She is alert. Mental status is at baseline.  Psychiatric:        Mood and Affect: Mood normal.         Behavior: Behavior normal.       Results for orders placed or performed in visit on 09/04/22  Fructosamine  Result Value Ref Range   Fructosamine 192 0 - 285 umol/L  VITAMIN D 25 Hydroxy (Vit-D Deficiency, Fractures)  Result Value Ref Range   Vit D, 25-Hydroxy 53.9 30.0 - 100.0 ng/mL  Hepatic function panel  Result Value Ref Range   Total Protein 6.2 6.0 -  8.5 g/dL   Albumin 4.1 3.8 - 4.9 g/dL   Bilirubin Total <0.2 0.0 - 1.2 mg/dL   Bilirubin, Direct <0.10 0.00 - 0.40 mg/dL   Alkaline Phosphatase 149 (H) 44 - 121 IU/L   AST 14 0 - 40 IU/L   ALT 14 0 - 32 IU/L   Assessment & Plan:   Problem List Items Addressed This Visit     Health maintenance examination - Primary (Chronic)    Preventative protocols reviewed and updated unless pt declined. Discussed healthy diet and lifestyle.       Smoker    Encouraged full smoking cessation.  Not eligible for lung cancer screening program (17.5 PY hx)      Dyslipidemia    Chronic, overall unchanged readings since starting pravastatin daily 2019. She does have fmhx CAD. With new fatty liver noted, will work towards better cholesterol control - start atorvastatin 54m in place of pravastatin daily. RTC 6 mo lab visit only.  The 10-year ASCVD risk score (Arnett DK, et al., 2019) is: 2%   Values used to calculate the score:     Age: [age]years     Sex: Female     Is Non-Hispanic African American: No     Diabetic: No     Tobacco smoker: No     Systolic Blood Pressure: 1301mmHg     Is BP treated: No     HDL Cholesterol: 46.2 mg/dL     Total Cholesterol: 199 mg/dL       Relevant Medications   atorvastatin (LIPITOR) 20 MG tablet   Other Relevant Orders   Lipid panel   Comprehensive metabolic panel   Prediabetes    Continue to avoid added sugars/sweetened beverages.  Reviewed labs from work - A1c 6.1% Update fructosamine today.       Relevant Orders   Fructosamine (Completed)   Obesity, Class I, BMI 30-34.9     Congratulated on weight loss to date - she has been working on this. Encouraged continue healthy diet and lifestyle choices to affect sustainable weight loss.       Adjustment disorder    Stable period on low dose lexapro 553m     Elevated alkaline phosphatase level    Normal fractionated percentages this year. RUQ [place] fatty liver changes 07/2022. Will conitnue to monitor this.       Relevant Orders   Hepatic function panel (Completed)   Comprehensive metabolic panel   Chronic fatigue    Fatigue improving on current vitamin D regimen.       NAFLD (nonalcoholic fatty liver disease)    Discussed fatty liver diagnosis. rec cholesterol, sugar and weight control.  Discussed Fib-4 score for NAFLD - low fibrosis risk.    Fibrosis 4 Score = .66 (Low risk)       Interpretation for patients with NAFLD          <1.30       -  F0-F1 (Low risk)          1.30-2.67 -  Indeterminate           >2.67      -  F3-F4 (High risk)     Validated for ages 3569-65    Vitamin D deficiency    Ccurrenly on vitamin D replacement -50k units weekly with 2000 IU daily.  Update vit D levels.       Relevant Orders   VITAMIN D 25 Hydroxy (Vit-D Deficiency, Fractures) (Completed)  Other Visit Diagnoses     Special screening for malignant neoplasms, colon       Relevant Orders   Fecal occult blood, imunochemical        Meds ordered this encounter  Medications   atorvastatin (LIPITOR) 20 MG tablet    Sig: Take 1 tablet (20 mg total) by mouth daily.    Dispense:  90 tablet    Refill:  3    To replace pravastatin   Orders Placed This Encounter  Procedures   Fecal occult blood, imunochemical    Standing Status:   Future    Number of Occurrences:   1    Standing Expiration Date:   09/04/2023   Fructosamine   VITAMIN D 25 Hydroxy (Vit-D Deficiency, Fractures)   Hepatic function panel   Lipid panel    Standing Status:   Future    Standing Expiration Date:   09/05/2023   Comprehensive  metabolic panel    Standing Status:   Future    Standing Expiration Date:   09/05/2023    Patient instructions: Start atorvastatin 8m in place of pravastatin.  Pass by lab to pick up stool kit.  Labs today.  We will request latest mamm[facility] from [facility] (Dr HMatthew Saras.  Good to see you today.  Return in 6 months for labs and in 1 year for next physical.   Follow up plan: Return in about 1 year (around 09/05/2023), or if symptoms worsen or fail to improve, for annual exam, prior fasting for blood work.  JRia Bush MD

## 2022-09-05 ENCOUNTER — Encounter: Payer: Self-pay | Admitting: Family Medicine

## 2022-09-05 LAB — FRUCTOSAMINE: Fructosamine: 192 umol/L (ref 0–285)

## 2022-09-05 LAB — HEPATIC FUNCTION PANEL
ALT: 14 IU/L (ref 0–32)
AST: 14 IU/L (ref 0–40)
Albumin: 4.1 g/dL (ref 3.8–4.9)
Alkaline Phosphatase: 149 IU/L — ABNORMAL HIGH (ref 44–121)
Bilirubin Total: <0.2 mg/dL (ref 0.0–1.2)
Bilirubin, Direct: <0.1 mg/dL (ref 0.00–0.40)
Total Protein: 6.2 g/dL (ref 6.0–8.5)

## 2022-09-05 LAB — VITAMIN D 25 HYDROXY (VIT D DEFICIENCY, FRACTURES): Vit D, 25-Hydroxy: 53.9 ng/mL (ref 30.0–100.0)

## 2022-09-08 ENCOUNTER — Encounter: Payer: Self-pay | Admitting: Family Medicine

## 2023-03-05 ENCOUNTER — Other Ambulatory Visit: Payer: Managed Care, Other (non HMO)

## 2023-03-09 ENCOUNTER — Other Ambulatory Visit (INDEPENDENT_AMBULATORY_CARE_PROVIDER_SITE_OTHER): Payer: Managed Care, Other (non HMO)

## 2023-03-09 DIAGNOSIS — E785 Hyperlipidemia, unspecified: Secondary | ICD-10-CM

## 2023-03-09 DIAGNOSIS — R748 Abnormal levels of other serum enzymes: Secondary | ICD-10-CM

## 2023-03-09 NOTE — Addendum Note (Signed)
Addended by: Ellamae Sia on: 03/09/2023 02:33 PM   Modules accepted: Orders

## 2023-03-10 LAB — LIPID PANEL
Chol/HDL Ratio: 3.7 ratio (ref 0.0–4.4)
Cholesterol, Total: 155 mg/dL (ref 100–199)
HDL: 42 mg/dL (ref >39)
LDL Chol Calc (NIH): 82 mg/dL (ref 0–99)
Triglycerides: 180 mg/dL — ABNORMAL HIGH (ref 0–149)
VLDL Cholesterol Cal: 31 mg/dL (ref 5–40)

## 2023-03-10 LAB — LAB REPORT - SCANNED: EGFR: 105

## 2023-03-10 LAB — COMPREHENSIVE METABOLIC PANEL
ALT: 14 IU/L (ref 0–32)
AST: 11 IU/L (ref 0–40)
Albumin/Globulin Ratio: 1.5 (ref 1.2–2.2)
Albumin: 4.1 g/dL (ref 3.8–4.9)
Alkaline Phosphatase: 176 IU/L — ABNORMAL HIGH (ref 44–121)
BUN/Creatinine Ratio: 14 (ref 9–23)
BUN: 9 mg/dL (ref 6–24)
Bilirubin Total: <0.2 mg/dL (ref 0.0–1.2)
CO2: 23 mmol/L (ref 20–29)
Calcium: 9.5 mg/dL (ref 8.7–10.2)
Chloride: 104 mmol/L (ref 96–106)
Creatinine, Ser: 0.63 mg/dL (ref 0.57–1.00)
Globulin, Total: 2.7 g/dL (ref 1.5–4.5)
Glucose: 119 mg/dL — ABNORMAL HIGH (ref 70–99)
Potassium: 4 mmol/L (ref 3.5–5.2)
Sodium: 143 mmol/L (ref 134–144)
Total Protein: 6.8 g/dL (ref 6.0–8.5)
eGFR: 105 mL/min/{1.73_m2} (ref >59)

## 2023-04-11 ENCOUNTER — Encounter: Payer: Self-pay | Admitting: Family Medicine

## 2023-04-11 ENCOUNTER — Ambulatory Visit: Payer: Managed Care, Other (non HMO) | Admitting: Family Medicine

## 2023-04-11 VITALS — BP 112/62 | HR 97 | Temp 97.5°F | Ht 67.75 in | Wt 194.0 lb

## 2023-04-11 DIAGNOSIS — M4310 Spondylolisthesis, site unspecified: Secondary | ICD-10-CM | POA: Insufficient documentation

## 2023-04-11 DIAGNOSIS — H6591 Unspecified nonsuppurative otitis media, right ear: Secondary | ICD-10-CM | POA: Diagnosis not present

## 2023-04-11 DIAGNOSIS — R42 Dizziness and giddiness: Secondary | ICD-10-CM

## 2023-04-11 MED ORDER — AZITHROMYCIN 250 MG PO TABS: ORAL_TABLET | ORAL | 0 refills | Status: AC

## 2023-04-11 NOTE — Assessment & Plan Note (Signed)
Acute, likely secondary to middle ear effusion. Treat with decongestant, Flonase and can use meclizine as needed.

## 2023-04-11 NOTE — Progress Notes (Signed)
Patient ID: Barbara Riley, female    DOB: 1966-10-21, 57 y.o.   MRN: [id number]  This visit was conducted in person.  BP 112/62 (BP Location: Left Arm, Patient Position: Sitting, Cuff Size: Normal)   Pulse 97   Temp (!) 97.5 F (36.4 C) (Temporal)   Ht 5' 7.75" (1.721 m)   Wt 194 lb (88 kg)   SpO2 100%   BMI 29.72 kg/m    CC:  Chief Complaint  Patient presents with   Dizziness    And bilateral ear pressure since last night. Patient is also now starting to get a slight headache.    Subjective:   HPI: Barbara Riley is a 57 y.o. female  pt of Dr. Reece Agar presenting on 04/11/2023 for Dizziness (And bilateral ear pressure since last night. Patient is also now starting to get a slight headache.)   New onset tightness in bilateral ears in last 2 weeks.  Pressure, no pain in ears.  New onset dizziness.Marland Kitchen describes as vertigo.   Worse with moving head, lying down.   Light headache.   No fever, no SOB.  NO cough, congestion, allergy symptoms.   She has not used anything.  Pat eardrum rupture surgery in 1985... laways have ear issues.        Relevant past medical, surgical, family and social history reviewed and updated as indicated. Interim medical history since our last visit reviewed. Allergies and medications reviewed and updated. Outpatient Medications Prior to Visit  Medication Sig Dispense Refill   atorvastatin (LIPITOR) 20 MG tablet Take 1 tablet (20 mg total) by mouth daily. 90 tablet 3   calcium citrate-vitamin D (CITRACAL+D) 315-200 MG-UNIT tablet Take 1 tablet by mouth daily.     celecoxib (CELEBREX) 200 MG capsule Take 200 mg by mouth daily as needed.     Cholecalciferol (VITAMIN D3) 1.25 MG (50000 UT) CAPS TAKE 1 CAPSULE BY MOUTH ONCE A WEEK 12 capsule 0   cyanocobalamin 1000 MCG tablet Take 1,000 mcg by mouth daily.     diphenhydrAMINE (BENADRYL) 25 mg capsule Take 1 capsule (25 mg total) by mouth every 6 (six) hours as needed.     escitalopram (LEXAPRO) 5 MG  tablet Take 1 tablet (5 mg total) by mouth daily.     Multiple Vitamin (MULTIVITAMIN) tablet Take 1 tablet by mouth daily.     No facility-administered medications prior to visit.     Per HPI unless specifically indicated in ROS section below Review of Systems  Constitutional:  Negative for fatigue and fever.  HENT:  Positive for ear pain. Negative for congestion, sinus pressure and sneezing.   Eyes:  Negative for pain.  Respiratory:  Negative for cough and shortness of breath.   Cardiovascular:  Negative for chest pain, palpitations and leg swelling.  Gastrointestinal:  Negative for abdominal pain.  Genitourinary:  Negative for dysuria and vaginal bleeding.  Musculoskeletal:  Negative for back pain.  Neurological:  Negative for syncope, light-headedness and headaches.  Psychiatric/Behavioral:  Negative for dysphoric mood.    Objective:  BP 112/62 (BP Location: Left Arm, Patient Position: Sitting, Cuff Size: Normal)   Pulse 97   Temp (!) 97.5 F (36.4 C) (Temporal)   Ht 5' 7.75" (1.721 m)   Wt 194 lb (88 kg)   SpO2 100%   BMI 29.72 kg/m   Wt Readings from Last 3 Encounters:  04/11/23 194 lb (88 kg)  09/04/22 194 lb 8 oz (88.2 kg)  07/25/22 195 lb 8  oz (88.7 kg)      Physical Exam Vitals and nursing note reviewed.  Constitutional:      General: She is not in acute distress.    Appearance: Normal appearance. She is well-developed. She is not ill-appearing or toxic-appearing.  HENT:     Head: Normocephalic.     Right Ear: Hearing, ear canal and external ear normal. A middle ear effusion is present. Tympanic membrane is bulging. Tympanic membrane is not erythematous or retracted.     Left Ear: Hearing, ear canal and external ear normal. A middle ear effusion is present. Tympanic membrane is not erythematous, retracted or bulging.     Ears:     Comments: Pus in right ear canal overlying TM     Nose: Nose normal. No mucosal edema or rhinorrhea.     Right Sinus: No maxillary  sinus tenderness or frontal sinus tenderness.     Left Sinus: No maxillary sinus tenderness or frontal sinus tenderness.     Mouth/Throat:     Pharynx: Uvula midline.  Eyes:     General: Lids are normal. Lids are everted, no foreign bodies appreciated.     Conjunctiva/sclera: Conjunctivae normal.     Pupils: Pupils are equal, round, and reactive to light.  Neck:     Thyroid: No thyroid mass or thyromegaly.     Vascular: No carotid bruit.     Trachea: Trachea normal.  Cardiovascular:     Rate and Rhythm: Normal rate and regular rhythm.     Pulses: Normal pulses.     Heart sounds: Normal heart sounds, S1 normal and S2 normal. No murmur heard.    No friction rub. No gallop.  Pulmonary:     Effort: Pulmonary effort is normal. No tachypnea or respiratory distress.     Breath sounds: Normal breath sounds. No decreased breath sounds, wheezing, rhonchi or rales.  Abdominal:     General: Bowel sounds are normal. There is no distension or abdominal bruit.     Palpations: Abdomen is soft. There is no fluid wave or mass.     Tenderness: There is no abdominal tenderness. There is no guarding or rebound.     Hernia: No hernia is present.  Musculoskeletal:     Cervical back: Normal range of motion and neck supple.  Lymphadenopathy:     Cervical: No cervical adenopathy.  Skin:    General: Skin is warm and dry.     Findings: No rash.  Neurological:     Mental Status: She is alert.     Cranial Nerves: No cranial nerve deficit.     Sensory: No sensory deficit.  Psychiatric:        Mood and Affect: Mood is not anxious or depressed.        Speech: Speech normal.        Behavior: Behavior normal. Behavior is cooperative.        Thought Content: Thought content normal.        Judgment: Judgment normal.       Results for orders placed or performed in visit on 03/09/23  Lipid panel  Result Value Ref Range   Cholesterol, Total 155 100 - 199 mg/dL   Triglycerides 161 (H) 0 - 149 mg/dL   HDL 42  >09 mg/dL   VLDL Cholesterol Cal 31 5 - 40 mg/dL   LDL Chol Calc (NIH) 82 0 - 99 mg/dL   Chol/HDL Ratio 3.7 0.0 - 4.4 ratio  Comprehensive metabolic panel  Result  Value Ref Range   Glucose 119 (H) 70 - 99 mg/dL   BUN 9 6 - 24 mg/dL   Creatinine, Ser 1.61 0.57 - 1.00 mg/dL   eGFR 096 >04 VW/UJW/1.19   BUN/Creatinine Ratio 14 9 - 23   Sodium 143 134 - 144 mmol/L   Potassium 4.0 3.5 - 5.2 mmol/L   Chloride 104 96 - 106 mmol/L   CO2 23 20 - 29 mmol/L   Calcium 9.5 8.7 - 10.2 mg/dL   Total Protein 6.8 6.0 - 8.5 g/dL   Albumin 4.1 3.8 - 4.9 g/dL   Globulin, Total 2.7 1.5 - 4.5 g/dL   Albumin/Globulin Ratio 1.5 1.2 - 2.2   Bilirubin Total <0.2 0.0 - 1.2 mg/dL   Alkaline Phosphatase 176 (H) 44 - 121 IU/L   AST 11 0 - 40 IU/L   ALT 14 0 - 32 IU/L    Assessment and Plan  There are no diagnoses linked to this encounter.  No follow-ups on file.   Kerby Nora, MD

## 2023-04-11 NOTE — Patient Instructions (Addendum)
Complete azithromycin course.  Start Flonase 2 sprays per nostril daily.  Can use Antivert / meclizine for vertigo if needed.   Call if not improving after antibiotics or if fever on antibiotics.

## 2023-04-11 NOTE — Assessment & Plan Note (Addendum)
Acute  Complete azithromycin course.  Start Flonase 2 sprays per nostril daily.  Can use Antivert / meclizine for vertigo if needed.   Call if not improving after antibiotics or if fever on antibiotics.

## 2023-04-14 ENCOUNTER — Telehealth: Payer: Self-pay | Admitting: Family Medicine

## 2023-04-14 DIAGNOSIS — R748 Abnormal levels of other serum enzymes: Secondary | ICD-10-CM

## 2023-04-14 NOTE — Telephone Encounter (Signed)
Plz notify - because liver function was elevated higher than previously on blood work last month, I'd like her to schedule lab visit after she feels better from recent ear infection to recheck this along with some other labs.  If staying elevated, may consider referring her to endocrinologist to ensure elevated ALP is not related to bone disorder.

## 2023-04-16 NOTE — Telephone Encounter (Signed)
Spoke with pt relaying Dr. G's message.  Pt verbalizes understanding and will call back to schedule lab visit.  

## 2023-04-20 ENCOUNTER — Other Ambulatory Visit (INDEPENDENT_AMBULATORY_CARE_PROVIDER_SITE_OTHER): Payer: Managed Care, Other (non HMO)

## 2023-04-20 DIAGNOSIS — R748 Abnormal levels of other serum enzymes: Secondary | ICD-10-CM | POA: Diagnosis not present

## 2023-04-20 NOTE — Addendum Note (Signed)
Addended by: Eual Fines on: 04/20/2023 02:08 PM   Modules accepted: Orders

## 2023-04-20 NOTE — Addendum Note (Signed)
Addended by: Mara Favero P on: 04/20/2023 02:08 PM   Modules accepted: Orders  

## 2023-04-20 NOTE — Addendum Note (Signed)
Addended by: Eual Fines on: 04/20/2023 02:07 PM   Modules accepted: Orders

## 2023-04-20 NOTE — Addendum Note (Signed)
Addended by: Dorianna Mckiver P on: 04/20/2023 02:07 PM   Modules accepted: Orders  

## 2023-04-21 LAB — VITAMIN D 25 HYDROXY (VIT D DEFICIENCY, FRACTURES): Vit D, 25-Hydroxy: 45.5 ng/mL (ref 30.0–100.0)

## 2023-04-21 LAB — HEPATIC FUNCTION PANEL
ALT: 15 IU/L (ref 0–32)
Albumin: 4.3 g/dL (ref 3.8–4.9)
Bilirubin Total: 0.2 mg/dL (ref 0.0–1.2)

## 2023-04-22 LAB — GAMMA GT: GGT: 17 IU/L (ref 0–60)

## 2023-04-22 LAB — HEPATIC FUNCTION PANEL
AST: 15 IU/L (ref 0–40)
Alkaline Phosphatase: 185 IU/L — ABNORMAL HIGH (ref 44–121)
Bilirubin, Direct: <0.1 mg/dL (ref 0.00–0.40)
Total Protein: 6.6 g/dL (ref 6.0–8.5)

## 2023-04-22 LAB — PTH, INTACT AND CALCIUM
Calcium: 9.7 mg/dL (ref 8.7–10.2)
PTH: 8 pg/mL — ABNORMAL LOW (ref 15–65)

## 2023-04-24 ENCOUNTER — Encounter: Payer: Self-pay | Admitting: Family Medicine

## 2023-04-24 ENCOUNTER — Other Ambulatory Visit: Payer: Self-pay | Admitting: Family Medicine

## 2023-04-24 DIAGNOSIS — E2839 Other primary ovarian failure: Secondary | ICD-10-CM

## 2023-04-24 DIAGNOSIS — R109 Unspecified abdominal pain: Secondary | ICD-10-CM

## 2023-04-24 DIAGNOSIS — E559 Vitamin D deficiency, unspecified: Secondary | ICD-10-CM

## 2023-04-24 DIAGNOSIS — E209 Hypoparathyroidism, unspecified: Secondary | ICD-10-CM

## 2023-04-24 DIAGNOSIS — F17200 Nicotine dependence, unspecified, uncomplicated: Secondary | ICD-10-CM

## 2023-05-10 NOTE — Addendum Note (Signed)
Addended by: Eustaquio Boyden on: 05/10/2023 12:53 PM   Modules accepted: Orders

## 2023-08-10 LAB — LIPID PANEL
Cholesterol: 129 (ref 0–200)
HDL: 36 (ref 35–70)
LDL Cholesterol: 70
Triglycerides: 127 (ref 40–160)

## 2023-08-10 LAB — HEMOGLOBIN A1C: Hemoglobin A1C: 5.9

## 2023-08-14 LAB — HM DIABETES EYE EXAM

## 2023-09-18 ENCOUNTER — Other Ambulatory Visit: Payer: Self-pay | Admitting: Family Medicine

## 2023-11-01 ENCOUNTER — Other Ambulatory Visit: Payer: Self-pay | Admitting: Obstetrics and Gynecology

## 2023-11-01 DIAGNOSIS — R928 Other abnormal and inconclusive findings on diagnostic imaging of breast: Secondary | ICD-10-CM

## 2023-11-20 ENCOUNTER — Ambulatory Visit
Admission: RE | Admit: 2023-11-20 | Discharge: 2023-11-20 | Disposition: A | Discharge status: Home / Self Care | Payer: Managed Care, Other (non HMO) | Source: Ambulatory Visit | Attending: Obstetrics and Gynecology | Admitting: Obstetrics and Gynecology

## 2023-11-20 ENCOUNTER — Other Ambulatory Visit: Payer: Self-pay | Admitting: Obstetrics and Gynecology

## 2023-11-20 DIAGNOSIS — R928 Other abnormal and inconclusive findings on diagnostic imaging of breast: Secondary | ICD-10-CM

## 2023-11-20 DIAGNOSIS — N6310 Unspecified lump in the right breast, unspecified quadrant: Secondary | ICD-10-CM

## 2023-11-28 ENCOUNTER — Ambulatory Visit: Payer: Managed Care, Other (non HMO) | Admitting: Family Medicine

## 2023-11-28 ENCOUNTER — Encounter: Payer: Self-pay | Admitting: Family Medicine

## 2023-11-28 VITALS — BP 120/74 | HR 81 | Temp 98.1°F | Ht 67.5 in | Wt 186.2 lb

## 2023-11-28 DIAGNOSIS — R748 Abnormal levels of other serum enzymes: Secondary | ICD-10-CM

## 2023-11-28 DIAGNOSIS — E209 Hypoparathyroidism, unspecified: Secondary | ICD-10-CM | POA: Diagnosis not present

## 2023-11-28 DIAGNOSIS — R7303 Prediabetes: Secondary | ICD-10-CM | POA: Diagnosis not present

## 2023-11-28 DIAGNOSIS — K760 Fatty (change of) liver, not elsewhere classified: Secondary | ICD-10-CM

## 2023-11-28 DIAGNOSIS — Z0000 Encounter for general adult medical examination without abnormal findings: Secondary | ICD-10-CM

## 2023-11-28 DIAGNOSIS — E663 Overweight: Secondary | ICD-10-CM

## 2023-11-28 DIAGNOSIS — E559 Vitamin D deficiency, unspecified: Secondary | ICD-10-CM

## 2023-11-28 DIAGNOSIS — G629 Polyneuropathy, unspecified: Secondary | ICD-10-CM

## 2023-11-28 DIAGNOSIS — E785 Hyperlipidemia, unspecified: Secondary | ICD-10-CM

## 2023-11-28 DIAGNOSIS — F4320 Adjustment disorder, unspecified: Secondary | ICD-10-CM

## 2023-11-28 DIAGNOSIS — M5000 Cervical disc disorder with myelopathy, unspecified cervical region: Secondary | ICD-10-CM

## 2023-11-28 DIAGNOSIS — F17200 Nicotine dependence, unspecified, uncomplicated: Secondary | ICD-10-CM

## 2023-11-28 MED ORDER — STRESS FORMULA/ZINC PO TABS: 1.0000 | ORAL_TABLET | Freq: Every day | ORAL | Status: AC

## 2023-11-28 MED ORDER — ESCITALOPRAM OXALATE 5 MG PO TABS: 5.0000 mg | ORAL_TABLET | Freq: Every day | ORAL | Status: DC

## 2023-11-28 NOTE — Assessment & Plan Note (Addendum)
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  Will continue to monitor.

## 2023-11-28 NOTE — Assessment & Plan Note (Signed)
Continues low dose lexapro through GYN.

## 2023-11-28 NOTE — Patient Instructions (Addendum)
Labs today  Check with GYN about DEXA scan. If unavailable, call [place] to schedule bone density scan: [facility] at [facility] [phone].  Continue working on quitting smoking!  We will refer you to lung cancer screening program.  Congrats on weight loss!  Return in 1 year for next physical.

## 2023-11-28 NOTE — Assessment & Plan Note (Addendum)
H/o this, RUQ US showed fatty liver changes 07/2022.  Normal fractionated isoenzymes 2022 and again in August 2023.  GGT was normal when last checked as well. Update ALP and if persistently elevated, discussed low threshold to further image with CT.

## 2023-11-28 NOTE — Assessment & Plan Note (Signed)
Continue to encourage full cessation. She may qualify for lung cancer screening and would be interested if eligible - will refer.

## 2023-11-28 NOTE — Assessment & Plan Note (Signed)
Congratulated on control to date as evidenced by A1c of 5.9% 07/2023.  Continue to avoid added sugar in diet.

## 2023-11-28 NOTE — Assessment & Plan Note (Addendum)
Congratulated on weight loss to date. 

## 2023-11-28 NOTE — Assessment & Plan Note (Signed)
She is only taking vit D + calcium daily. Will update levels.

## 2023-11-28 NOTE — Assessment & Plan Note (Signed)
S/p ACDF 05/2021 (Pool)

## 2023-11-28 NOTE — Assessment & Plan Note (Signed)
Chronic, improved readings with transition to atorvastatin - continue this.  The 10-year ASCVD risk score (Arnett DK, et al., 2019) is: 4.5%   Values used to calculate the score:     Age: 57 years     Sex: Female     Is Non-Hispanic African American: No     Diabetic: No     Tobacco smoker: Yes     Systolic Blood Pressure: 120 mmHg     Is BP treated: No     HDL Cholesterol: 42 mg/dL     Total Cholesterol: 155 mg/dL

## 2023-11-28 NOTE — Assessment & Plan Note (Signed)
Preventative protocols reviewed and updated unless pt declined. Discussed healthy diet and lifestyle.  

## 2023-11-28 NOTE — Progress Notes (Signed)
Ph: [phone] Fax: [phone]   Patient ID: [name], female    DOB: [birth year], 57 y.o.   MRN: [id number]  This visit was conducted in person.  BP 120/74   Pulse 81   Temp 98.1 F (36.7 C) (Oral)   Ht 5' 7.5" (1.715 m)   Wt 186 lb 4 oz (84.5 kg)   SpO2 95%   BMI 28.74 kg/m    CC: CPE Subjective:   HPI: Barbara Riley is a 57 y.o. female presenting on 11/28/2023 for Annual Exam (Pt provided copy of recent lab results. )   Brings labs from work - 08/10/2023 A1c 5.9, glu 107, TC 129, trig 127, HDL 36, LDL 70.  She stopped [facility] with significant weight loss.   HLD - pravastatin 20mg  --> atorvastatin 20mg  transitioned last year.  Strong paternal fmhx CAD/CVA (father, grandparents), brother age 9 with CAD.    Elevated alk phos - persistent over the years. Abd US showed fatty liver changes 2023. Normal liver fractions, normal bone fractions 07/2022. Some ongoing RLQ discomfort   Vit D - takes citracal + D as well as weekly Rx vit D.  She's been using ankle brace with benefit.  Known neuropathy - chronic issue to R side of body from mid abdomen down to feet. Sensation to right side of body is slowly improving, but notes ongoing paresthesias to skin of R abdomen.   Preventative: Colonoscopy planned 12/2023 Deboraha Sprang)  Well woman yearly with Dr. Marcelle Overlie [facility] - s/p D&C and ablation for heavy bleeding after stopped birth control. Prior on depo shots 10+ yrs.  LMP - unsure ~08/2019  Mammogram with GYN yearly last 11/2023 - dx mammo and [place] Birads3 likely benign cluster of cysts to R breast, planned rpt 6 month [place].  Lung cancer screening - interested - will refer Flu shot - declines COVID vaccine - Pfizer 07/2020, 08/2020, booster Tdap 2013 declines repeat at this time Shingrix - 01/2021 - bad reaction to it so will not complete  Seat belt use discussed  Sunscreen use discussed. No changing moles. Smoking - 1/2 ppd, started age 70 yo.  Alcohol - none   Dentist - yearly  Eye exam yearly    Caffeine: 1-2 cups tea/day Married, lives with husband and son   Occupation: [employer] employee Activity: no regular exercise  Diet: some water, fruits/vegetables daily, brings lunch from home, no fish     Relevant past medical, surgical, family and social history reviewed and updated as indicated. Interim medical history since our last visit reviewed. Allergies and medications reviewed and updated. Outpatient Medications Prior to Visit  Medication Sig Dispense Refill   atorvastatin (LIPITOR) 20 MG tablet TAKE 1 TABLET(20 MG) BY MOUTH DAILY 90 tablet 3   calcium citrate-vitamin D (CITRACAL+D) 315-200 MG-UNIT tablet Take 1 tablet by mouth daily.     diphenhydrAMINE (BENADRYL) 25 mg capsule Take 1 capsule (25 mg total) by mouth every 6 (six) hours as needed.     meloxicam (MOBIC) 15 MG tablet Take 15 mg by mouth daily.     Multiple Vitamin (MULTIVITAMIN) tablet Take 1 tablet by mouth daily.     Cholecalciferol (VITAMIN D3) 1.25 MG (50000 UT) CAPS TAKE 1 CAPSULE BY MOUTH ONCE A WEEK 12 capsule 0   cyanocobalamin 1000 MCG tablet Take 1,000 mcg by mouth daily.     escitalopram (LEXAPRO) 5 MG tablet Take 1 tablet (5 mg total) by mouth daily.     celecoxib (  CELEBREX) 200 MG capsule Take 200 mg by mouth daily as needed.     No facility-administered medications prior to visit.     Per HPI unless specifically indicated in ROS section below Review of Systems  Constitutional:  Negative for activity change, appetite change, chills, fatigue, fever and unexpected weight change.  HENT:  Negative for hearing loss.   Eyes:  Negative for visual disturbance.  Respiratory:  Negative for cough, chest tightness, shortness of breath and wheezing.   Cardiovascular:  Negative for chest pain, palpitations and leg swelling.  Gastrointestinal:  Positive for abdominal pain (occ RLQ discomfort). Negative for abdominal distention, blood in stool, constipation, diarrhea, nausea  and vomiting.  Genitourinary:  Negative for difficulty urinating and hematuria.  Musculoskeletal:  Negative for arthralgias, myalgias and neck pain.  Skin:  Negative for rash.  Neurological:  Negative for dizziness, seizures, syncope and headaches.  Hematological:  Negative for adenopathy. Does not bruise/bleed easily.  Psychiatric/Behavioral:  Negative for dysphoric mood. The patient is not nervous/anxious.     Objective:  BP 120/74   Pulse 81   Temp 98.1 F (36.7 C) (Oral)   Ht 5' 7.5" (1.715 m)   Wt 186 lb 4 oz (84.5 kg)   SpO2 95%   BMI 28.74 kg/m   Wt Readings from Last 3 Encounters:  11/28/23 186 lb 4 oz (84.5 kg)  04/11/23 194 lb (88 kg)  09/04/22 194 lb 8 oz (88.2 kg)      Physical Exam Vitals and nursing note reviewed.  Constitutional:      Appearance: Normal appearance. She is not ill-appearing.  HENT:     Head: Normocephalic and atraumatic.     Right Ear: Tympanic membrane, ear canal and external ear normal. There is no impacted cerumen.     Left Ear: Tympanic membrane, ear canal and external ear normal. There is no impacted cerumen.     Mouth/Throat:     Mouth: Mucous membranes are moist.     Pharynx: Oropharynx is clear. No oropharyngeal exudate or posterior oropharyngeal erythema.  Eyes:     General:        Right eye: No discharge.        Left eye: No discharge.     Extraocular Movements: Extraocular movements intact.     Conjunctiva/sclera: Conjunctivae normal.     Pupils: Pupils are equal, round, and reactive to light.  Neck:     Thyroid: No thyroid mass or thyromegaly.  Cardiovascular:     Rate and Rhythm: Normal rate and regular rhythm.     Pulses: Normal pulses.     Heart sounds: Normal heart sounds. No murmur heard. Pulmonary:     Effort: Pulmonary effort is normal. No respiratory distress.     Breath sounds: Normal breath sounds. No wheezing, rhonchi or rales.  Abdominal:     General: Bowel sounds are normal. There is no distension.      Palpations: Abdomen is soft. There is no mass.     Tenderness: There is no abdominal tenderness. There is no guarding or rebound.     Hernia: No hernia is present.  Musculoskeletal:     Cervical back: Normal range of motion and neck supple. No rigidity.     Right lower leg: No edema.     Left lower leg: No edema.  Lymphadenopathy:     Cervical: No cervical adenopathy.  Skin:    General: Skin is warm and dry.     Findings: No rash.  Neurological:  General: No focal deficit present.     Mental Status: She is alert. Mental status is at baseline.  Psychiatric:        Mood and Affect: Mood normal.        Behavior: Behavior normal.       Results for orders placed or performed in visit on 11/28/23  Lipid panel  Result Value Ref Range   Triglycerides 127 40 - 160   Cholesterol 129 0 - 200   HDL 36 35 - 70   LDL Cholesterol 70   Hemoglobin A1c  Result Value Ref Range   Hemoglobin A1C 5.9     Assessment & Plan:   Problem List Items Addressed This Visit     Health maintenance examination - Primary (Chronic)    Preventative protocols reviewed and updated unless pt declined. Discussed healthy diet and lifestyle.       Smoker    Continue to encourage full cessation. She may qualify for lung cancer screening and would be interested if eligible - will refer.      Relevant Orders   Ambulatory Referral for Lung Cancer Scre   Dyslipidemia    Chronic, improved readings with transition to atorvastatin - continue this.  The 10-year ASCVD risk score (Arnett DK, et al., 2019) is: 4.5%   Values used to calculate the score:     Age: 42 years     Sex: Female     Is Non-Hispanic African American: No     Diabetic: No     Tobacco smoker: Yes     Systolic Blood Pressure: 120 mmHg     Is BP treated: No     HDL Cholesterol: 42 mg/dL     Total Cholesterol: 155 mg/dL       Relevant Orders   Comprehensive metabolic panel   TSH   Prediabetes    Congratulated on control to date as  evidenced by A1c of 5.9% 07/2023.  Continue to avoid added sugar in diet.       Overweight (BMI 25.0-29.9)    Congratulated on weight loss to date.      Adjustment disorder    Continues low dose lexapro through GYN.       Elevated alkaline phosphatase level    H/o this, RUQ US showed fatty liver changes 07/2022.  Normal fractionated isoenzymes 2022 and again in August 2023.  GGT was normal when last checked as well. Update ALP and if persistently elevated, discussed low threshold to further image with CT.       Cervical disc disorder with myelopathy    S/p ACDF 05/2021 (Pool)      NAFLD (nonalcoholic fatty liver disease)   Vitamin D deficiency    She is only taking vit D + calcium daily. Will update levels.        Neuropathy    Ongoing neuropathy to RLQ abdomen with radiation down R leg possibly from prior cervical myelopathy s/p ACDF 2022 Poor tolerance to gabapentin and lyrica in the past.  Will continue to monitor.       Relevant Orders   Vitamin B1   Vitamin B12   Other Visit Diagnoses     Hypoparathyroidism, unspecified hypoparathyroidism type (HCC)       Relevant Orders   Magnesium   Parathyroid hormone, intact (no Ca)   Phosphorus        Meds ordered this encounter  Medications   Multiple Vitamins-Minerals (B COMPLEX-C-E-ZINC) tablet    Sig: Take 1 tablet  by mouth daily.   escitalopram (LEXAPRO) 5 MG tablet    Sig: Take 1 tablet (5 mg total) by mouth daily.    [place] [facility]    Orders Placed This Encounter  Procedures   Comprehensive metabolic panel   Magnesium   Parathyroid hormone, intact (no Ca)   Phosphorus   TSH   Vitamin B1   Vitamin B12   Lipid panel    This external order was created through the Results Console.   Hemoglobin A1c    This external order was created through the Results Console.   Ambulatory Referral for Lung Cancer Scre    Referral Priority:   Routine    Referral Type:   Consultation    Referral Reason:   Specialty  Services Required    Number of Visits Requested:   1    Patient Instructions  Labs today  Check with GYN about DEXA scan. If unavailable, call [place] to schedule bone density scan: [facility] [phone].  Continue working on quitting smoking!  We will refer you to lung cancer screening program.  Congrats on weight loss!  Return in 1 year for next physical.  Follow up plan: Return in about 1 year (around 11/27/2024) for annual exam, prior fasting for blood work.  Eustaquio Boyden, MD

## 2023-11-29 ENCOUNTER — Encounter: Payer: Self-pay | Admitting: Family Medicine

## 2023-12-01 LAB — COMPREHENSIVE METABOLIC PANEL
ALT: 14 [IU]/L (ref 0–32)
AST: 14 [IU]/L (ref 0–40)
Albumin: 4.1 g/dL (ref 3.8–4.9)
Alkaline Phosphatase: 175 [IU]/L — ABNORMAL HIGH (ref 44–121)
BUN/Creatinine Ratio: 16 (ref 9–23)
BUN: 10 mg/dL (ref 6–24)
Bilirubin Total: <0.2 mg/dL (ref 0.0–1.2)
CO2: 26 mmol/L (ref 20–29)
Calcium: 9.3 mg/dL (ref 8.7–10.2)
Chloride: 102 mmol/L (ref 96–106)
Creatinine, Ser: 0.61 mg/dL (ref 0.57–1.00)
Globulin, Total: 2.2 g/dL (ref 1.5–4.5)
Glucose: 116 mg/dL — ABNORMAL HIGH (ref 70–99)
Potassium: 4.4 mmol/L (ref 3.5–5.2)
Sodium: 143 mmol/L (ref 134–144)
Total Protein: 6.3 g/dL (ref 6.0–8.5)
eGFR: 105 mL/min/{1.73_m2} (ref >59)

## 2023-12-01 LAB — TSH: TSH: 0.482 u[IU]/mL (ref 0.450–4.500)

## 2023-12-01 LAB — PARATHYROID HORMONE, INTACT (NO CA): PTH: 16 pg/mL (ref 15–65)

## 2023-12-01 LAB — PHOSPHORUS: Phosphorus: 3.7 mg/dL (ref 3.0–4.3)

## 2023-12-01 LAB — VITAMIN B1

## 2023-12-01 LAB — MAGNESIUM: Magnesium: 2.3 mg/dL (ref 1.6–2.3)

## 2023-12-01 LAB — VITAMIN B12: Vitamin B-12: 678 pg/mL (ref 232–1245)

## 2023-12-03 LAB — COMPREHENSIVE METABOLIC PANEL

## 2023-12-03 LAB — LIPASE

## 2023-12-03 LAB — PARATHYROID HORMONE, INTACT (NO CA)

## 2023-12-03 LAB — MAGNESIUM

## 2023-12-03 LAB — VITAMIN B12

## 2023-12-03 LAB — LIPID PANEL

## 2023-12-03 LAB — PHOSPHORUS

## 2023-12-03 LAB — TSH

## 2023-12-03 LAB — VITAMIN B1: Thiamine: 183.3 nmol/L (ref 66.5–200.0)

## 2023-12-10 ENCOUNTER — Encounter: Payer: Self-pay | Admitting: Family Medicine

## 2023-12-10 DIAGNOSIS — R748 Abnormal levels of other serum enzymes: Secondary | ICD-10-CM

## 2023-12-10 DIAGNOSIS — R109 Unspecified abdominal pain: Secondary | ICD-10-CM

## 2023-12-10 DIAGNOSIS — K760 Fatty (change of) liver, not elsewhere classified: Secondary | ICD-10-CM

## 2023-12-17 NOTE — Telephone Encounter (Signed)
CT abd pelvis ordered for chronic right abd discomfort + chronically elevated alk phos

## 2023-12-19 HISTORY — PX: COLONOSCOPY: SHX174

## 2023-12-23 LAB — HM COLONOSCOPY

## 2024-01-16 ENCOUNTER — Encounter: Payer: Self-pay | Admitting: *Deleted

## 2024-01-28 ENCOUNTER — Encounter: Payer: Self-pay | Admitting: Emergency Medicine

## 2024-02-19 ENCOUNTER — Ambulatory Visit: Payer: Managed Care, Other (non HMO) | Admitting: Diagnostic Neuroimaging

## 2024-02-22 ENCOUNTER — Ambulatory Visit (INDEPENDENT_AMBULATORY_CARE_PROVIDER_SITE_OTHER): Admitting: Family Medicine

## 2024-02-22 ENCOUNTER — Encounter: Payer: Self-pay | Admitting: Family Medicine

## 2024-02-22 VITALS — BP 120/70 | HR 74 | Temp 98.3°F | Ht 67.5 in | Wt 187.0 lb

## 2024-02-22 DIAGNOSIS — F17200 Nicotine dependence, unspecified, uncomplicated: Secondary | ICD-10-CM | POA: Diagnosis not present

## 2024-02-22 DIAGNOSIS — H6991 Unspecified Eustachian tube disorder, right ear: Secondary | ICD-10-CM

## 2024-02-22 NOTE — Progress Notes (Signed)
 Subjective:    Patient ID: Barbara Riley, female    DOB: 1966-08-02, 58 y.o.   MRN: [id number]  HPI  Wt Readings from Last 3 Encounters:  02/22/24 187 lb (84.8 kg)  11/28/23 186 lb 4 oz (84.5 kg)  04/11/23 194 lb (88 kg)   28.86 kg/m  Vitals:   02/22/24 1437 02/22/24 1506  BP: (!) 142/84 120/70  Pulse: 74   Temp: 98.3 F (36.8 C)   SpO2: 97%    58 yo pt of Dr Reece Agar presents with ear fullness (chronic) on right side Also dry cough   She is a some day smoker   History of problems with right ear in past  Even hole in TM in 1980s was repaired   Sees ENT in past  Has to irrigate cerumen   She uses q tips carefully  Cerumen is very dark   Right ear is full feeling  Decreased hearing  Neck feels full as well   Had sinus headache past few days  Sneezing / congestion few days   Little dry cough  Throat feels full/not sore  No fever     Patient Active Problem List   Diagnosis Date Noted   Neuropathy 11/28/2023   Degenerative spondylolisthesis 04/11/2023   Vitamin D deficiency 08/30/2022   NAFLD (nonalcoholic fatty liver disease) 78/29/5621   Chronic fatigue 07/11/2022   Snoring 07/11/2022   Benign paroxysmal positional vertigo, bilateral 03/16/2022   OME (otitis media with effusion), right 03/16/2022   Cervical disc disorder with myelopathy 04/02/2021   Lumbar spinal stenosis 04/02/2021   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   Elevated alkaline phosphatase level 01/14/2021   Carpal tunnel syndrome of right wrist 08/17/2020   Carpal tunnel syndrome of left wrist 08/09/2020   Trigger finger of right hand 09/20/2018   Adjustment disorder 09/19/2017   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   Overweight (BMI 25.0-29.9) 09/09/2015   Prediabetes 08/27/2014   Dyslipidemia 08/07/2014   Health maintenance examination 12/27/2011   Smoker 12/27/2011   ETD (eustachian tube dysfunction) 10/12/2009   Neck pain 02/10/2009   Past Medical History:  Diagnosis Date   ANXIETY  04/15/2007   Qualifier: Diagnosis of  By: Alphonsus Sias MD, Ronnette Hila    Carpal tunnel syndrome    mild right sided (R median sensory nerve only   Cervical myelopathy (HCC) 08/29/2021   C4-5 level   Past Surgical History:  Procedure Laterality Date   ANTERIOR CERVICAL DECOMP/DISCECTOMY FUSION  05/2021   Dr Jordan Likes   CESAREAN SECTION     HYSTEROSCOPY WITH NOVASURE  08/2019   Dr Marcelle [facility] at [facility]   MIDDLE EAR SURGERY  1985   perf repair on right   Social History   Tobacco Use   Smoking status: Some Days    Current packs/day: 0.25    Types: Cigarettes   Smokeless tobacco: Never   Tobacco comments:    4-5 cigarettes daily  Vaping Use   Vaping status: Never Used  Substance Use Topics   Alcohol use: No    Alcohol/week: 0.0 standard drinks of alcohol   Drug use: No   Family History  Problem Relation Age of Onset   Heart disease Father 42       MI   Stroke Father 65   Stroke Paternal Grandfather    Heart disease Other        GM   Liver cancer Maternal Grandmother        GM   CAD Brother  58       stents   Allergies  Allergen Reactions   Penicillins     REACTION: rash   Shingrix [Zoster Vac Recomb Adjuvanted] Other (See Comments)    Residual skin sensitivity/heat sensation to R side of body that started after vaccine   Current Outpatient Medications on File Prior to Visit  Medication Sig Dispense Refill   atorvastatin (LIPITOR) 20 MG tablet TAKE 1 TABLET(20 MG) BY MOUTH DAILY 90 tablet 3   calcium citrate-vitamin D (CITRACAL+D) 315-200 MG-UNIT tablet Take 1 tablet by mouth daily.     diphenhydrAMINE (BENADRYL) 25 mg capsule Take 1 capsule (25 mg total) by mouth every 6 (six) hours as needed.     escitalopram (LEXAPRO) 5 MG tablet Take 1 tablet (5 mg total) by mouth daily.     fluticasone (FLONASE) 50 MCG/ACT nasal spray Place 2 sprays into both nostrils daily.     meloxicam (MOBIC) 15 MG tablet Take 15 mg by mouth daily.     Multiple Vitamin (MULTIVITAMIN)  tablet Take 1 tablet by mouth daily.     Multiple Vitamins-Minerals (B COMPLEX-C-E-ZINC) tablet Take 1 tablet by mouth daily.     No current facility-administered medications on file prior to visit.    Review of Systems  Constitutional:  Negative for chills and fever.  HENT:  Positive for congestion, hearing loss, postnasal drip and rhinorrhea. Negative for ear discharge, ear pain, facial swelling, sinus pain and trouble swallowing.        Right ear fullness  Right throat irritation     Respiratory:  Positive for cough.        Mild dry cough        Objective:   Physical Exam Constitutional:      General: She is not in acute distress.    Appearance: Normal appearance. She is well-developed and normal weight. She is not ill-appearing or diaphoretic.  HENT:     Head: Normocephalic and atraumatic.     Left Ear: Tympanic membrane, ear canal and external ear normal. There is no impacted cerumen.     Ears:     Comments: Left TM is visible past some cerumen (not an impaction) It is more dull than right  Did not appreciate effusion or erythema or bulging   No drainage No external ear pain or swelling     Nose: Congestion present.     Comments: Boggy nares     Mouth/Throat:     Mouth: Mucous membranes are moist.  Eyes:     General:        Right eye: No discharge.        Left eye: No discharge.     Conjunctiva/sclera: Conjunctivae normal.     Pupils: Pupils are equal, round, and reactive to light.  Neck:     Thyroid: No thyromegaly.     Vascular: No carotid bruit or JVD.  Cardiovascular:     Rate and Rhythm: Normal rate and regular rhythm.     Heart sounds: Normal heart sounds.     No gallop.  Pulmonary:     Effort: Pulmonary effort is normal. No respiratory distress.     Breath sounds: Normal breath sounds. No stridor. No wheezing, rhonchi or rales.     Comments: Mildly distant bs Good air exch  Abdominal:     General: There is no distension or abdominal bruit.      Palpations: Abdomen is soft.  Musculoskeletal:     Cervical back: Normal range  of motion and neck supple.     Right lower leg: No edema.     Left lower leg: No edema.  Lymphadenopathy:     Cervical: No cervical adenopathy.  Skin:    General: Skin is warm and dry.     Coloration: Skin is not pale.     Findings: No rash.  Neurological:     Mental Status: She is alert.     Cranial Nerves: No cranial nerve deficit.     Coordination: Coordination normal.     Deep Tendon Reflexes: Reflexes are normal and symmetric. Reflexes normal.  Psychiatric:        Mood and Affect: Mood normal.           Assessment & Plan:   Problem List Items Addressed This Visit       Nervous and Auditory   ETD (eustachian tube dysfunction) - Primary   In setting of smoking/ past right TM perf in past and history of recurrent cerumen impaction on right  Also with some congestion/rhinorrhea and throat irritation  Exam today is reassuring Partial (not total) cerumen deposit and able to see TM well which is dull but not infected appearing  Cannot r/o early viral uri / if so will update and need to run course  Could also be early seasonal allergies  Instructed to start using flonase (has at home) daily for 2-4 wk Update if not starting to improve in a week or if worsening  Call back and Er precautions noted in detail today          Other   Smoker   Pt may want to try chantix again if generic is available or covered  If so she will reach out to pcp   Encouraged her to consider this

## 2024-02-22 NOTE — Assessment & Plan Note (Signed)
 Pt may want to try chantix again if generic is available or covered  If so she will reach out to pcp   Encouraged her to consider this

## 2024-02-22 NOTE — Assessment & Plan Note (Signed)
 In setting of smoking/ past right TM perf in past and history of recurrent cerumen impaction on right  Also with some congestion/rhinorrhea and throat irritation  Exam today is reassuring Partial (not total) cerumen deposit and able to see TM well which is dull but not infected appearing  Cannot r/o early viral uri / if so will update and need to run course  Could also be early seasonal allergies  Instructed to start using flonase (has at home) daily for 2-4 wk Update if not starting to improve in a week or if worsening  Call back and Er precautions noted in detail today

## 2024-02-22 NOTE — Patient Instructions (Addendum)
 For ETD start back on flonase 2 sprays in each nostril once daily for at least 2-4 weeks Watch for ear pain   Continue ear wax products to prevent impaction   Update if not starting to improve in a week or if worsening   Keep thinking about quitting smoking

## 2024-07-14 ENCOUNTER — Ambulatory Visit: Payer: Self-pay

## 2024-07-14 ENCOUNTER — Ambulatory Visit: Admitting: Family Medicine

## 2024-07-14 VITALS — BP 110/62 | HR 83 | Temp 100.0°F | Ht 67.5 in | Wt 186.0 lb

## 2024-07-14 DIAGNOSIS — J029 Acute pharyngitis, unspecified: Secondary | ICD-10-CM | POA: Diagnosis not present

## 2024-07-14 DIAGNOSIS — J22 Unspecified acute lower respiratory infection: Secondary | ICD-10-CM | POA: Diagnosis not present

## 2024-07-14 DIAGNOSIS — R509 Fever, unspecified: Secondary | ICD-10-CM | POA: Diagnosis not present

## 2024-07-14 LAB — POCT RAPID STREP A (OFFICE): Rapid Strep A Screen: NEGATIVE

## 2024-07-14 LAB — POC COVID19 BINAXNOW: SARS Coronavirus 2 Ag: NEGATIVE

## 2024-07-14 NOTE — Telephone Encounter (Signed)
 FYI Only or Action Required?: FYI only for provider.  Patient was last seen in primary care on 02/22/2024 by Randeen Laine LABOR, MD.  Called Nurse Triage reporting Sore Throat.  Symptoms began several days ago.  Interventions attempted: OTC medications: Tylenol Cold & Flu.  Symptoms are: gradually worsening.  Triage Disposition: See Physician Within 24 Hours  Patient/caregiver understands and will follow disposition?: Yes      Copied from CRM [id number]. Topic: Clinical - Red Word Triage >> Jul 14, 2024  7:41 AM Donna BRAVO wrote: Red Word that prompted transfer to Nurse Triage: Patient has sore throat, hurts to swallow, fell like she's has a fever, not taken it, cough, Reason for Disposition  SEVERE throat pain (e.g., excruciating)  Answer Assessment - Initial Assessment Questions 1. ONSET: When did the throat start hurting? (Hours or days ago)      Saturday morning at 0330  2. SEVERITY: How bad is the sore throat? (Scale 1-10; mild, moderate or severe)     Patient is having difficulty talking with the pain in her throat and feels her throat is clogged on. And she states when she talks she coughs, and it is burning in her throat.   3. STREP EXPOSURE: Has there been any exposure to strep within the past week? If Yes, ask: What type of contact occurred?      Patient's son had the same symptoms last week but did not go to the doctor. Son had a sore throat and fever.  4.  VIRAL SYMPTOMS: Are there any symptoms of a cold, such as a runny nose, cough, hoarse voice or red eyes?      Cough, sore throat  5. FEVER: Do you have a fever? If Yes, ask: What is your temperature, how was it measured, and when did it start?     Haven't taken it but she states she feels like she has a fever, but is not sure  6. PUS ON THE TONSILS: Is there pus on the tonsils in the back of your throat?     No pus on tonsils  7. OTHER SYMPTOMS: Do you have any other symptoms? (e.g., difficulty  breathing, headache, rash)     Sore throat, coughing with phlegm, feels clogged in the throat  Denies: rash, difficulty breathing  Protocols used: Sore Throat-A-AH

## 2024-07-14 NOTE — Patient Instructions (Addendum)
 You have an upper respiratory infection, likely viral.  Antibiotics are not needed for this. Viral infections usually take 7-10 days to resolve.  The cough can last a few weeks to go away.  Suck on cold things like popsicles or use warm things like herbal tea - whichever soothes the throat better. Salt water gargles.  Use medication aleve 2 tablets twice daily with meals for next 3-5 days.  Ok to continue tylenol cold/flu.  Push fluids and plenty of rest.  Please return if you are not improving as expected, or if you have high fevers (>101.5) or difficulty swallowing or worsening productive cough.  Call clinic with questions.  Good to see you today. I hope you start feeling better soon.

## 2024-07-14 NOTE — Progress Notes (Signed)
 Ph: [phone] Fax: [phone]   Patient ID: Barbara Riley, female    DOB: [birth year], 58 y.o.   MRN: [id number]  This visit was conducted in person.  BP 110/62   Pulse 83   Temp 100 F (37.8 C) (Oral)   Ht 5' 7.5 (1.715 m)   Wt 186 lb (84.4 kg)   SpO2 96%   BMI 28.70 kg/m    CC: URI symptoms  Subjective:   HPI: Barbara Riley is a 58 y.o. female presenting on 07/14/2024 for Sore Throat (X 2 days with fever )   3d h/o ST, cough, fatigue. Having coughing fits.  Temp today elevated.   No fever/chills, ear or tooth pain, PNDrainage, significant congestion, no abd pain nausea, diarrhea, dyspnea.   Treating with tylenol cold/flu for last 2 days   No asthma hx  Son recently sick after coming home from camp.  Some-day smoker.      Relevant past medical, surgical, family and social history reviewed and updated as indicated. Interim medical history since our last visit reviewed. Allergies and medications reviewed and updated. Outpatient Medications Prior to Visit  Medication Sig Dispense Refill   atorvastatin  (LIPITOR) 20 MG tablet TAKE 1 TABLET(20 MG) BY MOUTH DAILY 90 tablet 3   calcium  citrate-vitamin D  (CITRACAL+D) 315-200 MG-UNIT tablet Take 1 tablet by mouth daily.     diphenhydrAMINE  (BENADRYL ) 25 mg capsule Take 1 capsule (25 mg total) by mouth every 6 (six) hours as needed.     escitalopram  (LEXAPRO ) 5 MG tablet Take 1 tablet (5 mg total) by mouth daily.     fluticasone (FLONASE) 50 MCG/ACT nasal spray Place 2 sprays into both nostrils daily.     meloxicam (MOBIC) 15 MG tablet Take 15 mg by mouth daily.     Multiple Vitamin (MULTIVITAMIN) tablet Take 1 tablet by mouth daily.     Multiple Vitamins-Minerals (B COMPLEX-C-E-ZINC ) tablet Take 1 tablet by mouth daily.     No facility-administered medications prior to visit.     Per HPI unless specifically indicated in ROS section below Review of Systems  Objective:  BP 110/62   Pulse 83   Temp 100 F (37.8  C) (Oral)   Ht 5' 7.5 (1.715 m)   Wt 186 lb (84.4 kg)   SpO2 96%   BMI 28.70 kg/m   Wt Readings from Last 3 Encounters:  07/14/24 186 lb (84.4 kg)  02/22/24 187 lb (84.8 kg)  11/28/23 186 lb 4 oz (84.5 kg)      Physical Exam Vitals and nursing note reviewed.  Constitutional:      Appearance: Normal appearance. She is not ill-appearing.  HENT:     Head: Normocephalic and atraumatic.     Right Ear: Tympanic membrane, ear canal and external ear normal. There is no impacted cerumen.     Left Ear: Tympanic membrane, ear canal and external ear normal. There is no impacted cerumen.     Nose: Nose normal. No congestion or rhinorrhea.     Mouth/Throat:     Mouth: Mucous membranes are moist.     Pharynx: Oropharynx is clear. Posterior oropharyngeal erythema (mild) present. No oropharyngeal exudate.     Tonsils: No tonsillar exudate.  Eyes:     Extraocular Movements: Extraocular movements intact.     Conjunctiva/sclera: Conjunctivae normal.     Pupils: Pupils are equal, round, and reactive to light.  Cardiovascular:     Rate and Rhythm: Normal rate and regular rhythm.  Pulses: Normal pulses.     Heart sounds: Normal heart sounds. No murmur heard. Pulmonary:     Effort: Pulmonary effort is normal. No respiratory distress.     Breath sounds: Normal breath sounds. No wheezing, rhonchi or rales.  Musculoskeletal:     Cervical back: Normal range of motion and neck supple.     Right lower leg: No edema.     Left lower leg: No edema.  Lymphadenopathy:     Head:     Right side of head: Tonsillar adenopathy present. No submental, submandibular, preauricular or posterior auricular adenopathy.     Left side of head: No submental, submandibular, tonsillar, preauricular or posterior auricular adenopathy.     Cervical: No cervical adenopathy.     Right cervical: No superficial cervical adenopathy.    Left cervical: No superficial cervical adenopathy.     Upper Body:     Right upper body:  No supraclavicular adenopathy.     Left upper body: No supraclavicular adenopathy.  Skin:    General: Skin is warm and dry.     Findings: No rash.  Neurological:     Mental Status: She is alert.  Psychiatric:        Mood and Affect: Mood normal.        Behavior: Behavior normal.       Results for orders placed or performed in visit on 07/14/24  POCT rapid strep A   Collection Time: 07/14/24  2:23 PM  Result Value Ref Range   Rapid Strep A Screen Negative Negative  POC COVID-19 BinaxNow   Collection Time: 07/14/24  2:26 PM  Result Value Ref Range   SARS Coronavirus 2 Ag Negative Negative     Assessment & Plan:   Problem List Items Addressed This Visit     Acute respiratory infection - Primary   Anticipate viral given short duration. Tested negative for strep and COVID Recommendations made. Supportive measures reviewed.  Update if not improving with treatment.       Other Visit Diagnoses       Fever, unspecified fever cause       Relevant Orders   POC COVID-19 BinaxNow (Completed)   POCT rapid strep A (Completed)     Sore throat       Relevant Orders   POC COVID-19 BinaxNow (Completed)   POCT rapid strep A (Completed)        No orders of the defined types were placed in this encounter.   Orders Placed This Encounter  Procedures   POC COVID-19 BinaxNow   POCT rapid strep A    Patient Instructions  You have an upper respiratory infection, likely viral.  Antibiotics are not needed for this. Viral infections usually take 7-10 days to resolve.  The cough can last a few weeks to go away.  Suck on cold things like popsicles or use warm things like herbal tea - whichever soothes the throat better. Salt water gargles.  Use medication aleve 2 tablets twice daily with meals for next 3-5 days.  Ok to continue tylenol cold/flu.  Push fluids and plenty of rest.  Please return if you are not improving as expected, or if you have high fevers (>101.5) or difficulty  swallowing or worsening productive cough.  Call clinic with questions.  Good to see you today. I hope you start feeling better soon.   Follow up plan: Return if symptoms worsen or fail to improve.  Anton Blas, MD

## 2024-07-14 NOTE — Assessment & Plan Note (Addendum)
 Anticipate viral given short duration. Tested negative for strep and COVID Recommendations made. Supportive measures reviewed.  Update if not improving with treatment.

## 2024-07-14 NOTE — Telephone Encounter (Signed)
Will see at 2pm today

## 2024-08-15 LAB — LAB REPORT - SCANNED: A1c: 6

## 2024-10-03 ENCOUNTER — Other Ambulatory Visit: Payer: Self-pay | Admitting: Family Medicine

## 2024-10-03 ENCOUNTER — Telehealth: Payer: Self-pay

## 2024-10-03 MED ORDER — ATORVASTATIN CALCIUM 20 MG PO TABS: 20.0000 mg | ORAL_TABLET | Freq: Every day | ORAL | 0 refills | Status: DC

## 2024-10-03 NOTE — Addendum Note (Signed)
 Addended by: RILLA BALLER on: 10/03/2024 05:09 PM   Modules accepted: Orders

## 2024-10-03 NOTE — Progress Notes (Signed)
 ERx atorva.

## 2024-10-03 NOTE — Telephone Encounter (Signed)
 Not sure why it was denied. Pt has CPE appt scheduled for 11/2024.  Refilled, pt notified.

## 2024-10-03 NOTE — Telephone Encounter (Signed)
 Copied from CRM [id number]. Topic: Clinical - Medication Question >> Oct 03, 2024  3:16 PM Franky GRADE wrote: Reason for CRM: Patient is calling because the pharmacy informed her that Dr. Rilla denied the refill for atorvastatin  (LIPITOR) 20 MG tablet [[id number]], she only has two tablets left and has her annual physical scheduled for 12/01/2024 so she is not sure why it would be denied.

## 2024-12-01 ENCOUNTER — Ambulatory Visit: Admitting: Family Medicine

## 2024-12-01 ENCOUNTER — Encounter: Payer: Self-pay | Admitting: Family Medicine

## 2024-12-01 VITALS — BP 120/80 | HR 76 | Temp 97.8°F | Ht 67.5 in | Wt 189.8 lb

## 2024-12-01 DIAGNOSIS — K760 Fatty (change of) liver, not elsewhere classified: Secondary | ICD-10-CM

## 2024-12-01 DIAGNOSIS — E785 Hyperlipidemia, unspecified: Secondary | ICD-10-CM | POA: Diagnosis not present

## 2024-12-01 DIAGNOSIS — R7303 Prediabetes: Secondary | ICD-10-CM

## 2024-12-01 DIAGNOSIS — G629 Polyneuropathy, unspecified: Secondary | ICD-10-CM | POA: Diagnosis not present

## 2024-12-01 DIAGNOSIS — M5000 Cervical disc disorder with myelopathy, unspecified cervical region: Secondary | ICD-10-CM | POA: Diagnosis not present

## 2024-12-01 DIAGNOSIS — Z0000 Encounter for general adult medical examination without abnormal findings: Secondary | ICD-10-CM

## 2024-12-01 DIAGNOSIS — Z23 Encounter for immunization: Secondary | ICD-10-CM

## 2024-12-01 DIAGNOSIS — E559 Vitamin D deficiency, unspecified: Secondary | ICD-10-CM

## 2024-12-01 DIAGNOSIS — F17200 Nicotine dependence, unspecified, uncomplicated: Secondary | ICD-10-CM | POA: Diagnosis not present

## 2024-12-01 DIAGNOSIS — R748 Abnormal levels of other serum enzymes: Secondary | ICD-10-CM

## 2024-12-01 MED ORDER — ATORVASTATIN CALCIUM 20 MG PO TABS: 20.0000 mg | ORAL_TABLET | Freq: Every day | ORAL | 3 refills | Status: AC

## 2024-12-01 MED ORDER — VARENICLINE TARTRATE (STARTER) 0.5 MG X 11 & 1 MG X 42 PO TBPK: ORAL_TABLET | ORAL | 0 refills | Status: AC

## 2024-12-01 MED ORDER — VARENICLINE TARTRATE(CONTINUE) 1 MG PO TABS: 1.0000 | ORAL_TABLET | Freq: Two times a day (BID) | ORAL | 1 refills | Status: AC

## 2024-12-01 NOTE — Patient Instructions (Addendum)
 Labs today  Tdap today  Consider pneumonia shot (prevnar-20).  We will request records from [facility] GI colonoscopy 2025 Try generic chantix  (varenicline ) for smoking cessation.  Return as needed or in 1 year for next physical

## 2024-12-01 NOTE — Assessment & Plan Note (Signed)
 Levels stable in prediabetes range - encouraged she continue to watch added sugar in diet

## 2024-12-01 NOTE — Assessment & Plan Note (Addendum)
 H/o this with chronic mild alk phos elevation - update levels with CBC to update Fib4 score - traditionally low risk.

## 2024-12-01 NOTE — Assessment & Plan Note (Signed)
 Chronic great control on atorvastatin  - continue. The ASCVD Risk score (Arnett DK, et al., 2019) failed to calculate for the following reasons:   The valid total cholesterol range is 130 to 320 mg/dL   * - Cholesterol units were assumed

## 2024-12-01 NOTE — Progress Notes (Signed)
 Ph: [phone] Fax: [phone]   Patient ID: Barbara Riley, female    DOB: [birth year], 58 y.o.   MRN: [id number]  This visit was conducted in person.  BP 120/80 (Cuff Size: Normal)   Pulse 76   Temp 97.8 F (36.6 C) (Oral)   Ht 5' 7.5 (1.715 m)   Wt 189 lb 12.8 oz (86.1 kg)   SpO2 97%   BMI 29.29 kg/m    CC: CPE Subjective:   HPI: Barbara Riley is a 58 y.o. female presenting on 12/01/2024 for Annual Exam (Tdap - vax)   Brings labs from work  08/15/2024 - glu 91, TC 147, trig 134, HDL 42, LDL 81, A1c 6.0% 08/10/2023 A1c 5.9, glu 107, TC 129, trig 127, HDL 36, LDL 70.   HLD - on atorvastatin  20mg , tolerating well.  Strong paternal fmhx CAD/CVA (father, grandparents), brother age 66 with CAD.   Elevated alk phos - persistent over the years. Abd US  showed fatty liver changes 2023. Normal liver fractions (2023, ), normal bone fractions 07/2022. Notes ongoing RLQ discomfort   Vit D - takes citracal + D as well as weekly Rx vit D.   H/o neuropathy after shingrix shot as well as cervical myelopathy - chronic issue to R side of body from mid abdomen down to feet. Sensation to right side of body is slowly improving, but notes ongoing paresthesias to skin of R abdomen. May be slowly getting better. Dizziness also improving.  S/p neck surgery 05/2021.    Preventative: Colonoscopy done 12/2023 [facility])  - records requested told 15 polyps  Well woman yearly with Dr. [facility] - s/p D&C and ablation for heavy bleeding after stopped birth control. Prior on depo shots 10+ yrs. Upcoming visit/pap 12/15/2024.  LMP - unsure ~08/2019  Mammogram with GYN yearly last 11/2023 - dx mammo and US  Birads3 likely benign cluster of cysts to R breast, rpt pending.  Lung cancer screening - may be interested  Flu shot - declines COVID vaccine - Pfizer 07/2020, 08/2020, booster Prevnar-20 - to consider Tdap 2013, rpt today  Shingrix - 01/2021 - bad reaction to it so will not  complete  Seat belt use discussed  Sunscreen use discussed. No changing moles.  Smoking - 1/2 ppd, started age 55 yo.  Alcohol - none  Dentist - yearly  Eye exam yearly    Caffeine: 1-2 cups tea/day Married, lives with husband and son   Occupation: [employer] employee Activity: no regular exercise  Diet: some water, fruits/vegetables daily, brings lunch from home, no fish     Relevant past medical, surgical, family and social history reviewed and updated as indicated. Interim medical history since our last visit reviewed. Allergies and medications reviewed and updated. Outpatient Medications Prior to Visit  Medication Sig Dispense Refill   calcium  citrate-vitamin D  (CITRACAL+D) 315-200 MG-UNIT tablet Take 1 tablet by mouth daily.     diphenhydrAMINE  (BENADRYL ) 25 mg capsule Take 1 capsule (25 mg total) by mouth every 6 (six) hours as needed.     escitalopram  (LEXAPRO ) 10 MG tablet Take 10 mg by mouth daily.     fluticasone (FLONASE) 50 MCG/ACT nasal spray Place 2 sprays into both nostrils daily. (Patient taking differently: Place 2 sprays into both nostrils as needed.)     Multiple Vitamin (MULTIVITAMIN) tablet Take 1 tablet by mouth daily.     Multiple Vitamins-Minerals (B COMPLEX-C-E-ZINC ) tablet Take 1 tablet by mouth daily.     atorvastatin  (LIPITOR)  20 MG tablet Take 1 tablet (20 mg total) by mouth daily. 90 tablet 0   escitalopram  (LEXAPRO ) 5 MG tablet Take 1 tablet (5 mg total) by mouth daily. (Patient not taking: Reported on 12/01/2024)     meloxicam (MOBIC) 15 MG tablet Take 15 mg by mouth daily. (Patient not taking: Reported on 12/01/2024)     No facility-administered medications prior to visit.     Per HPI unless specifically indicated in ROS section below Review of Systems  Constitutional:  Negative for activity change, appetite change, chills, fatigue, fever and unexpected weight change.  HENT:  Negative for hearing loss.   Eyes:  Negative for visual disturbance.   Respiratory:  Negative for cough, chest tightness, shortness of breath and wheezing.   Cardiovascular:  Negative for chest pain, palpitations and leg swelling.  Gastrointestinal:  Negative for abdominal distention, abdominal pain, blood in stool, constipation, diarrhea, nausea and vomiting.  Genitourinary:  Negative for difficulty urinating and hematuria.  Musculoskeletal:  Negative for arthralgias, myalgias and neck pain.  Skin:  Negative for rash.  Neurological:  Negative for dizziness, seizures, syncope and headaches.  Hematological:  Negative for adenopathy. Bruises/bleeds easily (occ).  Psychiatric/Behavioral:  Negative for dysphoric mood. The patient is not nervous/anxious.     Objective:  BP 120/80 (Cuff Size: Normal)   Pulse 76   Temp 97.8 F (36.6 C) (Oral)   Ht 5' 7.5 (1.715 m)   Wt 189 lb 12.8 oz (86.1 kg)   SpO2 97%   BMI 29.29 kg/m   Wt Readings from Last 3 Encounters:  12/01/24 189 lb 12.8 oz (86.1 kg)  07/14/24 186 lb (84.4 kg)  02/22/24 187 lb (84.8 kg)      Physical Exam Vitals and nursing note reviewed.  Constitutional:      Appearance: Normal appearance. She is not ill-appearing.  HENT:     Head: Normocephalic and atraumatic.     Right Ear: Tympanic membrane, ear canal and external ear normal. There is no impacted cerumen.     Left Ear: Tympanic membrane, ear canal and external ear normal. There is no impacted cerumen.     Mouth/Throat:     Mouth: Mucous membranes are moist.     Pharynx: Oropharynx is clear. No oropharyngeal exudate or posterior oropharyngeal erythema.  Eyes:     General:        Right eye: No discharge.        Left eye: No discharge.     Extraocular Movements: Extraocular movements intact.     Conjunctiva/sclera: Conjunctivae normal.     Pupils: Pupils are equal, round, and reactive to light.  Neck:     Thyroid: No thyroid mass or thyromegaly.  Cardiovascular:     Rate and Rhythm: Normal rate and regular rhythm.     Pulses:  Normal pulses.     Heart sounds: Normal heart sounds. No murmur heard. Pulmonary:     Effort: Pulmonary effort is normal. No respiratory distress.     Breath sounds: Normal breath sounds. No wheezing, rhonchi or rales.  Abdominal:     General: Bowel sounds are normal. There is no distension.     Palpations: Abdomen is soft. There is no mass.     Tenderness: There is no abdominal tenderness. There is no guarding or rebound.     Hernia: No hernia is present.  Musculoskeletal:     Cervical back: Normal range of motion and neck supple. No rigidity.     Right lower leg:  No edema.     Left lower leg: No edema.  Lymphadenopathy:     Cervical: No cervical adenopathy.  Skin:    General: Skin is warm and dry.     Findings: No rash.  Neurological:     General: No focal deficit present.     Mental Status: She is alert. Mental status is at baseline.  Psychiatric:        Mood and Affect: Mood normal.        Behavior: Behavior normal.       Results for orders placed or performed in visit on 07/14/24  POCT rapid strep A   Collection Time: 07/14/24  2:23 PM  Result Value Ref Range   Rapid Strep A Screen Negative Negative  POC COVID-19 BinaxNow   Collection Time: 07/14/24  2:26 PM  Result Value Ref Range   SARS Coronavirus 2 Ag Negative Negative   A1c 6% (08/15/2024)  Assessment & Plan:   Problem List Items Addressed This Visit     Health maintenance examination - Primary (Chronic)   Preventative protocols reviewed and updated unless pt declined. Discussed healthy diet and lifestyle.       Smoker   Continued smoker - interested in generic chantix  trial.  Reviewed side effects to monitor for.       Dyslipidemia   Chronic great control on atorvastatin  - continue. The ASCVD Risk score (Arnett DK, et al., 2019) failed to calculate for the following reasons:   The valid total cholesterol range is 130 to 320 mg/dL   * - Cholesterol units were assumed       Relevant Medications    atorvastatin  (LIPITOR) 20 MG tablet   Prediabetes   Levels stable in prediabetes range - encouraged she continue to watch added sugar in diet       Elevated alkaline phosphatase level   Chronic, mild elevation. Pt overall asxs. She does have h/o  fatty liver changes - presumed from this.  Previous fractionated %s normal.  GGT normal. Discussed with patient. Could consider CT imaging vs rpt US . Await LFTs, CBC to check fib4 score.       Relevant Orders   Comprehensive metabolic panel with GFR   CBC with Differential/Platelet   Cervical disc disorder with myelopathy   S/p ACDF 05/2022 (Pool)      NAFLD (nonalcoholic fatty liver disease)   H/o this with chronic mild alk phos elevation - update levels with CBC to update Fib4 score - traditionally low risk.       Vitamin D  deficiency   Update levels only on cal + vit D3 daily.       Relevant Orders   VITAMIN D  25 Hydroxy (Vit-D Deficiency, Fractures)   Neuropathy   Chronic, ongoing slow improvement since neck surgery 2022        Meds ordered this encounter  Medications   atorvastatin  (LIPITOR) 20 MG tablet    Sig: Take 1 tablet (20 mg total) by mouth daily.    Dispense:  90 tablet    Refill:  3   Varenicline  Tartrate, Starter, 0.5 MG X 11 & 1 MG X 42 TBPK    Sig: Use as directed    Dispense:  53 each    Refill:  0   Varenicline  Tartrate,Continue, 1 MG TABS    Sig: Take 1 tablet by mouth in the morning and at bedtime.    Dispense:  60 tablet    Refill:  1    Orders  Placed This Encounter  Procedures   Tdap vaccine greater than or equal to 7yo IM   Comprehensive metabolic panel with GFR   VITAMIN D  25 Hydroxy (Vit-D Deficiency, Fractures)   CBC with Differential/Platelet    Patient Instructions  Labs today  Tdap today  Consider pneumonia shot (prevnar-20).  We will request records from [facility] GI colonoscopy 2025 Try generic chantix  (varenicline ) for smoking cessation.  Return as needed or in 1 year for next  physical   Follow up plan: Return in about 1 year (around 12/01/2025), or if symptoms worsen or fail to improve, for annual exam, prior fasting for blood work.  Anton Blas, MD

## 2024-12-01 NOTE — Assessment & Plan Note (Signed)
 Continued smoker - interested in generic chantix  trial.  Reviewed side effects to monitor for.

## 2024-12-01 NOTE — Assessment & Plan Note (Signed)
 S/p ACDF 05/2022 (Pool)

## 2024-12-01 NOTE — Assessment & Plan Note (Signed)
 Chronic, mild elevation. Pt overall asxs. She does have h/o  fatty liver changes - presumed from this.  Previous fractionated %s normal.  GGT normal. Discussed with patient. Could consider CT imaging vs rpt US . Await LFTs, CBC to check fib4 score.

## 2024-12-01 NOTE — Assessment & Plan Note (Signed)
 Update levels only on cal + vit D3 daily.

## 2024-12-01 NOTE — Assessment & Plan Note (Signed)
 Chronic, ongoing slow improvement since neck surgery 2022

## 2024-12-01 NOTE — Assessment & Plan Note (Signed)
 Preventative protocols reviewed and updated unless pt declined. Discussed healthy diet and lifestyle.

## 2024-12-02 ENCOUNTER — Other Ambulatory Visit: Payer: Self-pay | Admitting: Obstetrics and Gynecology

## 2024-12-02 DIAGNOSIS — N6310 Unspecified lump in the right breast, unspecified quadrant: Secondary | ICD-10-CM

## 2024-12-02 LAB — COMPREHENSIVE METABOLIC PANEL WITH GFR
ALT: 16 IU/L (ref 0–32)
AST: 17 IU/L (ref 0–40)
Albumin: 4.2 g/dL (ref 3.8–4.9)
Alkaline Phosphatase: 162 IU/L — ABNORMAL HIGH (ref 49–135)
BUN/Creatinine Ratio: 22 (ref 9–23)
BUN: 14 mg/dL (ref 6–24)
Bilirubin Total: <0.2 mg/dL (ref 0.0–1.2)
CO2: 24 mmol/L (ref 20–29)
Calcium: 9.5 mg/dL (ref 8.7–10.2)
Chloride: 100 mmol/L (ref 96–106)
Creatinine, Ser: 0.63 mg/dL (ref 0.57–1.00)
Globulin, Total: 2.2 g/dL (ref 1.5–4.5)
Glucose: 92 mg/dL (ref 70–99)
Potassium: 4.1 mmol/L (ref 3.5–5.2)
Sodium: 138 mmol/L (ref 134–144)
Total Protein: 6.4 g/dL (ref 6.0–8.5)
eGFR: 103 mL/min/1.73 (ref >59)

## 2024-12-02 LAB — CBC WITH DIFFERENTIAL/PLATELET
Basophils Absolute: 0.1 x10E3/uL (ref 0.0–0.2)
Basos: 1 %
EOS (ABSOLUTE): 0.2 x10E3/uL (ref 0.0–0.4)
Eos: 2 %
Hematocrit: 41.2 % (ref 34.0–46.6)
Hemoglobin: 13.3 g/dL (ref 11.1–15.9)
Immature Grans (Abs): 0 x10E3/uL (ref 0.0–0.1)
Immature Granulocytes: 0 %
Lymphocytes Absolute: 3.3 x10E3/uL — ABNORMAL HIGH (ref 0.7–3.1)
Lymphs: 32 %
MCH: 29.5 pg (ref 26.6–33.0)
MCHC: 32.3 g/dL (ref 31.5–35.7)
MCV: 91 fL (ref 79–97)
Monocytes Absolute: 0.8 x10E3/uL (ref 0.1–0.9)
Monocytes: 8 %
Neutrophils Absolute: 5.8 x10E3/uL (ref 1.4–7.0)
Neutrophils: 57 %
Platelets: 331 x10E3/uL (ref 150–450)
RBC: 4.51 x10E6/uL (ref 3.77–5.28)
RDW: 13.4 % (ref 11.7–15.4)
WBC: 10.1 x10E3/uL (ref 3.4–10.8)

## 2024-12-02 LAB — VITAMIN D 25 HYDROXY (VIT D DEFICIENCY, FRACTURES): Vit D, 25-Hydroxy: 81 ng/mL (ref 30.0–100.0)

## 2024-12-08 ENCOUNTER — Ambulatory Visit: Payer: Self-pay | Admitting: Family Medicine

## 2024-12-13 ENCOUNTER — Encounter: Payer: Self-pay | Admitting: Family Medicine

## 2025-01-01 ENCOUNTER — Ambulatory Visit
Admission: RE | Admit: 2025-01-01 | Discharge: 2025-01-01 | Disposition: A | Source: Ambulatory Visit | Attending: Obstetrics and Gynecology | Admitting: Obstetrics and Gynecology

## 2025-01-01 DIAGNOSIS — N6310 Unspecified lump in the right breast, unspecified quadrant: Secondary | ICD-10-CM

## 2025-01-07 ENCOUNTER — Other Ambulatory Visit: Payer: Self-pay | Admitting: Family Medicine

## 2025-01-14 ENCOUNTER — Telehealth: Payer: Self-pay

## 2025-01-14 NOTE — Telephone Encounter (Signed)
 Copied from CRM #[id number]. Topic: Clinical - Request for Lab/Test Order >> Jan 14, 2025  9:06 AM Ahlexyia S wrote: Reason for CRM: Pt called in stating that she was advised by provider to have a CT scan of her liver recently but wanted to think about it. Pt stated she would like to have that done now due to her feeling some hotness. Pt is requesting a callback when this is ready to be scheduled.
